# Patient Record
Sex: Female | Born: 1955
Health system: Southern US, Community
[De-identification: ages and names within clinical notes are randomized; demographics above are authoritative.]

## PROBLEM LIST (undated history)

## (undated) DIAGNOSIS — I83813 Varicose veins of bilateral lower extremities with pain: Secondary | ICD-10-CM

## (undated) DIAGNOSIS — F32A Depression, unspecified: Secondary | ICD-10-CM

## (undated) DIAGNOSIS — K2970 Gastritis, unspecified, without bleeding: Secondary | ICD-10-CM

## (undated) DIAGNOSIS — F419 Anxiety disorder, unspecified: Secondary | ICD-10-CM

## (undated) DIAGNOSIS — M1990 Unspecified osteoarthritis, unspecified site: Secondary | ICD-10-CM

## (undated) DIAGNOSIS — G4700 Insomnia, unspecified: Secondary | ICD-10-CM

## (undated) DIAGNOSIS — E785 Hyperlipidemia, unspecified: Secondary | ICD-10-CM

## (undated) DIAGNOSIS — I10 Essential (primary) hypertension: Secondary | ICD-10-CM

## (undated) DIAGNOSIS — K449 Diaphragmatic hernia without obstruction or gangrene: Secondary | ICD-10-CM

## (undated) DIAGNOSIS — R739 Hyperglycemia, unspecified: Secondary | ICD-10-CM

## (undated) HISTORY — DX: Essential (primary) hypertension: I10

## (undated) HISTORY — DX: Insomnia, unspecified: G47.00

## (undated) HISTORY — DX: Diaphragmatic hernia without obstruction or gangrene: K44.9

## (undated) HISTORY — PX: DILATION AND CURETTAGE OF UTERUS: SHX78

## (undated) HISTORY — DX: Depression, unspecified: F32.A

## (undated) HISTORY — DX: Unspecified osteoarthritis, unspecified site: M19.90

## (undated) HISTORY — DX: Hyperglycemia, unspecified: R73.9

## (undated) HISTORY — DX: Varicose veins of bilateral lower extremities with pain: I83.813

## (undated) HISTORY — DX: Hyperlipidemia, unspecified: E78.5

## (undated) HISTORY — PX: OTHER SURGICAL HISTORY: SHX169

## (undated) HISTORY — DX: Anxiety disorder, unspecified: F41.9

## (undated) HISTORY — DX: Gastritis, unspecified, without bleeding: K29.70

---

## 1991-12-09 HISTORY — PX: OTHER SURGICAL HISTORY: SHX169

## 1998-04-13 ENCOUNTER — Ambulatory Visit (HOSPITAL_COMMUNITY): Admission: RE | Admit: 1998-04-13 | Discharge: 1998-04-13 | Payer: Self-pay | Admitting: *Deleted

## 1998-04-16 ENCOUNTER — Encounter: Admission: RE | Admit: 1998-04-16 | Discharge: 1998-07-15 | Payer: Self-pay | Admitting: *Deleted

## 1998-08-06 ENCOUNTER — Encounter: Admission: RE | Admit: 1998-08-06 | Discharge: 1998-11-04 | Payer: Self-pay | Admitting: *Deleted

## 1998-08-16 ENCOUNTER — Other Ambulatory Visit: Admission: RE | Admit: 1998-08-16 | Discharge: 1998-08-16 | Payer: Self-pay | Admitting: Obstetrics and Gynecology

## 1999-08-19 ENCOUNTER — Other Ambulatory Visit: Admission: RE | Admit: 1999-08-19 | Discharge: 1999-08-19 | Payer: Self-pay | Admitting: Obstetrics and Gynecology

## 2000-03-05 ENCOUNTER — Encounter: Payer: Self-pay | Admitting: Obstetrics and Gynecology

## 2000-03-05 ENCOUNTER — Ambulatory Visit (HOSPITAL_COMMUNITY): Admission: RE | Admit: 2000-03-05 | Discharge: 2000-03-05 | Payer: Self-pay | Admitting: Obstetrics and Gynecology

## 2000-09-16 ENCOUNTER — Other Ambulatory Visit: Admission: RE | Admit: 2000-09-16 | Discharge: 2000-09-16 | Payer: Self-pay | Admitting: Obstetrics and Gynecology

## 2001-05-10 ENCOUNTER — Ambulatory Visit (HOSPITAL_COMMUNITY): Admission: RE | Admit: 2001-05-10 | Discharge: 2001-05-10 | Payer: Self-pay | Admitting: Obstetrics and Gynecology

## 2001-05-10 ENCOUNTER — Encounter: Payer: Self-pay | Admitting: Obstetrics and Gynecology

## 2001-10-12 ENCOUNTER — Other Ambulatory Visit: Admission: RE | Admit: 2001-10-12 | Discharge: 2001-10-12 | Payer: Self-pay | Admitting: Obstetrics and Gynecology

## 2002-08-15 ENCOUNTER — Encounter: Payer: Self-pay | Admitting: Obstetrics and Gynecology

## 2002-08-15 ENCOUNTER — Ambulatory Visit (HOSPITAL_COMMUNITY): Admission: RE | Admit: 2002-08-15 | Discharge: 2002-08-15 | Payer: Self-pay | Admitting: Obstetrics and Gynecology

## 2002-11-07 HISTORY — PX: FOOT FRACTURE SURGERY: SHX645

## 2003-03-09 ENCOUNTER — Other Ambulatory Visit: Admission: RE | Admit: 2003-03-09 | Discharge: 2003-03-09 | Payer: Self-pay | Admitting: Obstetrics and Gynecology

## 2003-08-18 ENCOUNTER — Ambulatory Visit (HOSPITAL_COMMUNITY): Admission: RE | Admit: 2003-08-18 | Discharge: 2003-08-18 | Payer: Self-pay | Admitting: Obstetrics and Gynecology

## 2003-08-18 ENCOUNTER — Encounter: Payer: Self-pay | Admitting: Obstetrics and Gynecology

## 2004-03-25 ENCOUNTER — Other Ambulatory Visit: Admission: RE | Admit: 2004-03-25 | Discharge: 2004-03-25 | Payer: Self-pay | Admitting: Obstetrics and Gynecology

## 2004-03-28 ENCOUNTER — Encounter: Admission: RE | Admit: 2004-03-28 | Discharge: 2004-03-28 | Payer: Self-pay | Admitting: Obstetrics and Gynecology

## 2004-08-21 ENCOUNTER — Ambulatory Visit (HOSPITAL_COMMUNITY): Admission: RE | Admit: 2004-08-21 | Discharge: 2004-08-21 | Payer: Self-pay | Admitting: Obstetrics and Gynecology

## 2005-08-13 ENCOUNTER — Other Ambulatory Visit: Admission: RE | Admit: 2005-08-13 | Discharge: 2005-08-13 | Payer: Self-pay | Admitting: Obstetrics and Gynecology

## 2005-09-26 ENCOUNTER — Ambulatory Visit (HOSPITAL_COMMUNITY): Admission: RE | Admit: 2005-09-26 | Discharge: 2005-09-26 | Payer: Self-pay | Admitting: Obstetrics and Gynecology

## 2006-10-09 ENCOUNTER — Ambulatory Visit (HOSPITAL_COMMUNITY): Admission: RE | Admit: 2006-10-09 | Discharge: 2006-10-09 | Payer: Self-pay | Admitting: Obstetrics and Gynecology

## 2007-10-12 ENCOUNTER — Encounter: Admission: RE | Admit: 2007-10-12 | Discharge: 2007-10-12 | Payer: Self-pay | Admitting: Interventional Radiology

## 2007-10-13 ENCOUNTER — Ambulatory Visit (HOSPITAL_COMMUNITY): Admission: RE | Admit: 2007-10-13 | Discharge: 2007-10-13 | Payer: Self-pay | Admitting: Obstetrics and Gynecology

## 2008-02-29 ENCOUNTER — Encounter: Admission: RE | Admit: 2008-02-29 | Discharge: 2008-02-29 | Payer: Self-pay | Admitting: Interventional Radiology

## 2008-03-07 ENCOUNTER — Encounter: Admission: RE | Admit: 2008-03-07 | Discharge: 2008-03-07 | Payer: Self-pay | Admitting: Interventional Radiology

## 2008-04-11 ENCOUNTER — Encounter: Admission: RE | Admit: 2008-04-11 | Discharge: 2008-04-11 | Payer: Self-pay | Admitting: Interventional Radiology

## 2008-04-19 ENCOUNTER — Encounter: Admission: RE | Admit: 2008-04-19 | Discharge: 2008-04-19 | Payer: Self-pay | Admitting: Interventional Radiology

## 2008-05-10 ENCOUNTER — Encounter: Admission: RE | Admit: 2008-05-10 | Discharge: 2008-05-10 | Payer: Self-pay | Admitting: Interventional Radiology

## 2008-10-03 ENCOUNTER — Encounter: Admission: RE | Admit: 2008-10-03 | Discharge: 2008-10-03 | Payer: Self-pay | Admitting: Interventional Radiology

## 2008-11-24 ENCOUNTER — Ambulatory Visit (HOSPITAL_COMMUNITY): Admission: RE | Admit: 2008-11-24 | Discharge: 2008-11-24 | Payer: Self-pay | Admitting: Obstetrics and Gynecology

## 2009-12-26 ENCOUNTER — Ambulatory Visit (HOSPITAL_COMMUNITY): Admission: RE | Admit: 2009-12-26 | Discharge: 2009-12-26 | Payer: Self-pay | Admitting: Obstetrics and Gynecology

## 2010-01-01 ENCOUNTER — Encounter: Admission: RE | Admit: 2010-01-01 | Discharge: 2010-01-01 | Payer: Self-pay | Admitting: Obstetrics and Gynecology

## 2010-12-29 ENCOUNTER — Encounter: Payer: Self-pay | Admitting: Obstetrics and Gynecology

## 2011-01-01 ENCOUNTER — Encounter
Admission: RE | Admit: 2011-01-01 | Discharge: 2011-01-01 | Payer: Self-pay | Source: Home / Self Care | Attending: Interventional Radiology | Admitting: Interventional Radiology

## 2011-01-14 ENCOUNTER — Telehealth: Payer: Self-pay | Admitting: Radiology

## 2011-01-15 ENCOUNTER — Telehealth: Payer: Self-pay | Admitting: Radiology

## 2011-01-15 ENCOUNTER — Other Ambulatory Visit: Payer: Self-pay | Admitting: Interventional Radiology

## 2011-01-15 DIAGNOSIS — I83899 Varicose veins of unspecified lower extremities with other complications: Secondary | ICD-10-CM

## 2011-01-15 DIAGNOSIS — I8390 Asymptomatic varicose veins of unspecified lower extremity: Secondary | ICD-10-CM

## 2011-01-15 DIAGNOSIS — I83819 Varicose veins of unspecified lower extremities with pain: Secondary | ICD-10-CM

## 2011-01-16 ENCOUNTER — Telehealth: Payer: Self-pay | Admitting: Radiology

## 2011-02-04 ENCOUNTER — Other Ambulatory Visit: Payer: Self-pay

## 2011-02-04 ENCOUNTER — Ambulatory Visit: Payer: Self-pay

## 2011-02-06 ENCOUNTER — Other Ambulatory Visit: Payer: Self-pay | Admitting: Obstetrics and Gynecology

## 2011-02-06 DIAGNOSIS — Z1231 Encounter for screening mammogram for malignant neoplasm of breast: Secondary | ICD-10-CM

## 2011-02-11 ENCOUNTER — Other Ambulatory Visit: Payer: Self-pay

## 2011-02-28 ENCOUNTER — Ambulatory Visit
Admission: RE | Admit: 2011-02-28 | Discharge: 2011-02-28 | Disposition: A | Discharge status: Home / Self Care | Payer: BC Managed Care – PPO | Source: Ambulatory Visit | Attending: Obstetrics and Gynecology | Admitting: Obstetrics and Gynecology

## 2011-02-28 DIAGNOSIS — Z1231 Encounter for screening mammogram for malignant neoplasm of breast: Secondary | ICD-10-CM

## 2011-05-16 NOTE — Telephone Encounter (Signed)
See above telephone encounter notes

## 2011-05-16 NOTE — Telephone Encounter (Signed)
See above telephone encounter note 

## 2011-05-29 NOTE — Telephone Encounter (Signed)
Phone call reminder

## 2014-03-23 ENCOUNTER — Other Ambulatory Visit: Payer: Self-pay | Admitting: *Deleted

## 2014-03-23 DIAGNOSIS — I83893 Varicose veins of bilateral lower extremities with other complications: Secondary | ICD-10-CM

## 2014-04-06 ENCOUNTER — Other Ambulatory Visit: Payer: Self-pay | Admitting: Endocrinology

## 2014-04-06 ENCOUNTER — Ambulatory Visit
Admission: RE | Admit: 2014-04-06 | Discharge: 2014-04-06 | Disposition: A | Discharge status: Home / Self Care | Payer: BC Managed Care – PPO | Source: Ambulatory Visit | Attending: Endocrinology | Admitting: Endocrinology

## 2014-04-06 DIAGNOSIS — R079 Chest pain, unspecified: Secondary | ICD-10-CM

## 2014-05-23 ENCOUNTER — Encounter: Payer: BC Managed Care – PPO | Admitting: Vascular Surgery

## 2014-05-23 ENCOUNTER — Encounter (HOSPITAL_COMMUNITY): Payer: BC Managed Care – PPO

## 2015-05-02 ENCOUNTER — Other Ambulatory Visit (HOSPITAL_COMMUNITY): Payer: Self-pay | Admitting: Obstetrics and Gynecology

## 2015-05-02 DIAGNOSIS — Z1231 Encounter for screening mammogram for malignant neoplasm of breast: Secondary | ICD-10-CM

## 2015-05-16 ENCOUNTER — Ambulatory Visit (HOSPITAL_COMMUNITY): Payer: BLUE CROSS/BLUE SHIELD | Attending: Obstetrics and Gynecology

## 2015-05-30 ENCOUNTER — Ambulatory Visit (HOSPITAL_COMMUNITY)
Admission: RE | Admit: 2015-05-30 | Discharge: 2015-05-30 | Disposition: A | Discharge status: Home / Self Care | Payer: BLUE CROSS/BLUE SHIELD | Source: Ambulatory Visit | Attending: Obstetrics and Gynecology | Admitting: Obstetrics and Gynecology

## 2015-05-30 DIAGNOSIS — Z1231 Encounter for screening mammogram for malignant neoplasm of breast: Secondary | ICD-10-CM

## 2016-03-10 DIAGNOSIS — R739 Hyperglycemia, unspecified: Secondary | ICD-10-CM | POA: Diagnosis not present

## 2016-03-10 DIAGNOSIS — I10 Essential (primary) hypertension: Secondary | ICD-10-CM | POA: Diagnosis not present

## 2016-03-10 DIAGNOSIS — F329 Major depressive disorder, single episode, unspecified: Secondary | ICD-10-CM | POA: Diagnosis not present

## 2016-03-10 DIAGNOSIS — Z1389 Encounter for screening for other disorder: Secondary | ICD-10-CM | POA: Diagnosis not present

## 2016-03-10 DIAGNOSIS — I491 Atrial premature depolarization: Secondary | ICD-10-CM | POA: Diagnosis not present

## 2016-05-19 ENCOUNTER — Other Ambulatory Visit: Payer: Self-pay | Admitting: Obstetrics and Gynecology

## 2016-05-19 DIAGNOSIS — Z1231 Encounter for screening mammogram for malignant neoplasm of breast: Secondary | ICD-10-CM

## 2016-05-30 DIAGNOSIS — H0014 Chalazion left upper eyelid: Secondary | ICD-10-CM | POA: Diagnosis not present

## 2016-06-02 ENCOUNTER — Ambulatory Visit
Admission: RE | Admit: 2016-06-02 | Discharge: 2016-06-02 | Disposition: A | Discharge status: Home / Self Care | Payer: BLUE CROSS/BLUE SHIELD | Source: Ambulatory Visit | Attending: Obstetrics and Gynecology | Admitting: Obstetrics and Gynecology

## 2016-06-02 DIAGNOSIS — Z1231 Encounter for screening mammogram for malignant neoplasm of breast: Secondary | ICD-10-CM | POA: Diagnosis not present

## 2016-06-04 ENCOUNTER — Other Ambulatory Visit: Payer: Self-pay | Admitting: Obstetrics and Gynecology

## 2016-06-04 DIAGNOSIS — R928 Other abnormal and inconclusive findings on diagnostic imaging of breast: Secondary | ICD-10-CM

## 2016-06-05 ENCOUNTER — Ambulatory Visit
Admission: RE | Admit: 2016-06-05 | Discharge: 2016-06-05 | Disposition: A | Discharge status: Home / Self Care | Payer: BLUE CROSS/BLUE SHIELD | Source: Ambulatory Visit | Attending: Obstetrics and Gynecology | Admitting: Obstetrics and Gynecology

## 2016-06-05 DIAGNOSIS — N63 Unspecified lump in breast: Secondary | ICD-10-CM | POA: Diagnosis not present

## 2016-06-05 DIAGNOSIS — R928 Other abnormal and inconclusive findings on diagnostic imaging of breast: Secondary | ICD-10-CM

## 2016-06-05 DIAGNOSIS — N6002 Solitary cyst of left breast: Secondary | ICD-10-CM | POA: Diagnosis not present

## 2016-06-11 ENCOUNTER — Other Ambulatory Visit: Payer: BLUE CROSS/BLUE SHIELD

## 2016-07-29 DIAGNOSIS — R7309 Other abnormal glucose: Secondary | ICD-10-CM | POA: Diagnosis not present

## 2016-07-29 DIAGNOSIS — Z0000 Encounter for general adult medical examination without abnormal findings: Secondary | ICD-10-CM | POA: Diagnosis not present

## 2016-08-05 DIAGNOSIS — Z23 Encounter for immunization: Secondary | ICD-10-CM | POA: Diagnosis not present

## 2016-08-05 DIAGNOSIS — I10 Essential (primary) hypertension: Secondary | ICD-10-CM | POA: Diagnosis not present

## 2016-08-05 DIAGNOSIS — Z0000 Encounter for general adult medical examination without abnormal findings: Secondary | ICD-10-CM | POA: Diagnosis not present

## 2016-08-05 DIAGNOSIS — E784 Other hyperlipidemia: Secondary | ICD-10-CM | POA: Diagnosis not present

## 2016-08-05 DIAGNOSIS — R7309 Other abnormal glucose: Secondary | ICD-10-CM | POA: Diagnosis not present

## 2016-08-05 DIAGNOSIS — Z6835 Body mass index (BMI) 35.0-35.9, adult: Secondary | ICD-10-CM | POA: Diagnosis not present

## 2016-08-05 DIAGNOSIS — Z1389 Encounter for screening for other disorder: Secondary | ICD-10-CM | POA: Diagnosis not present

## 2016-08-25 DIAGNOSIS — N9089 Other specified noninflammatory disorders of vulva and perineum: Secondary | ICD-10-CM | POA: Diagnosis not present

## 2016-11-19 DIAGNOSIS — J029 Acute pharyngitis, unspecified: Secondary | ICD-10-CM | POA: Diagnosis not present

## 2016-11-19 DIAGNOSIS — R05 Cough: Secondary | ICD-10-CM | POA: Diagnosis not present

## 2017-02-11 DIAGNOSIS — F3341 Major depressive disorder, recurrent, in partial remission: Secondary | ICD-10-CM | POA: Diagnosis not present

## 2017-02-11 DIAGNOSIS — F1020 Alcohol dependence, uncomplicated: Secondary | ICD-10-CM | POA: Diagnosis not present

## 2017-02-11 DIAGNOSIS — F341 Dysthymic disorder: Secondary | ICD-10-CM | POA: Diagnosis not present

## 2017-04-09 DIAGNOSIS — L710 Perioral dermatitis: Secondary | ICD-10-CM | POA: Diagnosis not present

## 2017-04-24 DIAGNOSIS — L237 Allergic contact dermatitis due to plants, except food: Secondary | ICD-10-CM | POA: Diagnosis not present

## 2017-05-07 DIAGNOSIS — L739 Follicular disorder, unspecified: Secondary | ICD-10-CM | POA: Diagnosis not present

## 2017-07-21 DIAGNOSIS — Z01419 Encounter for gynecological examination (general) (routine) without abnormal findings: Secondary | ICD-10-CM | POA: Diagnosis not present

## 2017-07-21 DIAGNOSIS — Z6835 Body mass index (BMI) 35.0-35.9, adult: Secondary | ICD-10-CM | POA: Diagnosis not present

## 2017-08-04 DIAGNOSIS — M545 Low back pain: Secondary | ICD-10-CM | POA: Diagnosis not present

## 2017-08-04 DIAGNOSIS — M25552 Pain in left hip: Secondary | ICD-10-CM | POA: Diagnosis not present

## 2017-08-05 DIAGNOSIS — Z13820 Encounter for screening for osteoporosis: Secondary | ICD-10-CM | POA: Diagnosis not present

## 2017-08-06 DIAGNOSIS — M5442 Lumbago with sciatica, left side: Secondary | ICD-10-CM | POA: Diagnosis not present

## 2017-08-06 DIAGNOSIS — M545 Low back pain: Secondary | ICD-10-CM | POA: Diagnosis not present

## 2017-09-17 ENCOUNTER — Other Ambulatory Visit: Payer: Self-pay | Admitting: Obstetrics and Gynecology

## 2017-09-17 DIAGNOSIS — Z1231 Encounter for screening mammogram for malignant neoplasm of breast: Secondary | ICD-10-CM

## 2017-09-29 DIAGNOSIS — F341 Dysthymic disorder: Secondary | ICD-10-CM | POA: Diagnosis not present

## 2017-09-29 DIAGNOSIS — F1020 Alcohol dependence, uncomplicated: Secondary | ICD-10-CM | POA: Diagnosis not present

## 2017-09-29 DIAGNOSIS — F331 Major depressive disorder, recurrent, moderate: Secondary | ICD-10-CM | POA: Diagnosis not present

## 2017-10-02 ENCOUNTER — Ambulatory Visit: Payer: BLUE CROSS/BLUE SHIELD

## 2017-10-25 DIAGNOSIS — M5442 Lumbago with sciatica, left side: Secondary | ICD-10-CM | POA: Diagnosis not present

## 2017-10-25 DIAGNOSIS — Z79899 Other long term (current) drug therapy: Secondary | ICD-10-CM | POA: Diagnosis not present

## 2017-10-25 DIAGNOSIS — Z791 Long term (current) use of non-steroidal anti-inflammatories (NSAID): Secondary | ICD-10-CM | POA: Diagnosis not present

## 2017-10-26 DIAGNOSIS — M5442 Lumbago with sciatica, left side: Secondary | ICD-10-CM | POA: Diagnosis not present

## 2017-10-27 ENCOUNTER — Ambulatory Visit: Payer: BLUE CROSS/BLUE SHIELD

## 2017-10-28 DIAGNOSIS — M545 Low back pain: Secondary | ICD-10-CM | POA: Diagnosis not present

## 2017-11-03 DIAGNOSIS — Z01812 Encounter for preprocedural laboratory examination: Secondary | ICD-10-CM | POA: Diagnosis not present

## 2017-11-03 DIAGNOSIS — M47817 Spondylosis without myelopathy or radiculopathy, lumbosacral region: Secondary | ICD-10-CM | POA: Diagnosis not present

## 2017-11-03 DIAGNOSIS — M545 Low back pain: Secondary | ICD-10-CM | POA: Diagnosis not present

## 2017-11-04 DIAGNOSIS — M545 Low back pain: Secondary | ICD-10-CM | POA: Diagnosis not present

## 2017-11-10 DIAGNOSIS — I10 Essential (primary) hypertension: Secondary | ICD-10-CM | POA: Diagnosis not present

## 2017-11-10 DIAGNOSIS — Z0000 Encounter for general adult medical examination without abnormal findings: Secondary | ICD-10-CM | POA: Diagnosis not present

## 2017-11-10 DIAGNOSIS — R7309 Other abnormal glucose: Secondary | ICD-10-CM | POA: Diagnosis not present

## 2017-11-12 DIAGNOSIS — L308 Other specified dermatitis: Secondary | ICD-10-CM | POA: Diagnosis not present

## 2017-11-12 DIAGNOSIS — Z1389 Encounter for screening for other disorder: Secondary | ICD-10-CM | POA: Diagnosis not present

## 2017-11-12 DIAGNOSIS — M545 Low back pain: Secondary | ICD-10-CM | POA: Diagnosis not present

## 2017-11-12 DIAGNOSIS — R7309 Other abnormal glucose: Secondary | ICD-10-CM | POA: Diagnosis not present

## 2017-11-12 DIAGNOSIS — Z23 Encounter for immunization: Secondary | ICD-10-CM | POA: Diagnosis not present

## 2017-11-12 DIAGNOSIS — Z6835 Body mass index (BMI) 35.0-35.9, adult: Secondary | ICD-10-CM | POA: Diagnosis not present

## 2017-11-12 DIAGNOSIS — Z0000 Encounter for general adult medical examination without abnormal findings: Secondary | ICD-10-CM | POA: Diagnosis not present

## 2017-11-12 DIAGNOSIS — R748 Abnormal levels of other serum enzymes: Secondary | ICD-10-CM | POA: Diagnosis not present

## 2017-11-18 ENCOUNTER — Other Ambulatory Visit: Payer: Self-pay | Admitting: Internal Medicine

## 2017-11-18 DIAGNOSIS — R748 Abnormal levels of other serum enzymes: Secondary | ICD-10-CM

## 2017-11-23 DIAGNOSIS — M5126 Other intervertebral disc displacement, lumbar region: Secondary | ICD-10-CM | POA: Diagnosis not present

## 2017-11-23 DIAGNOSIS — M5416 Radiculopathy, lumbar region: Secondary | ICD-10-CM | POA: Diagnosis not present

## 2017-11-23 DIAGNOSIS — M545 Low back pain: Secondary | ICD-10-CM | POA: Diagnosis not present

## 2017-11-27 DIAGNOSIS — M549 Dorsalgia, unspecified: Secondary | ICD-10-CM | POA: Diagnosis not present

## 2017-11-27 DIAGNOSIS — M546 Pain in thoracic spine: Secondary | ICD-10-CM | POA: Diagnosis not present

## 2017-11-27 DIAGNOSIS — M48062 Spinal stenosis, lumbar region with neurogenic claudication: Secondary | ICD-10-CM | POA: Diagnosis not present

## 2017-11-27 DIAGNOSIS — M4306 Spondylolysis, lumbar region: Secondary | ICD-10-CM | POA: Diagnosis not present

## 2017-11-27 DIAGNOSIS — M5136 Other intervertebral disc degeneration, lumbar region: Secondary | ICD-10-CM | POA: Diagnosis not present

## 2017-11-27 DIAGNOSIS — M5416 Radiculopathy, lumbar region: Secondary | ICD-10-CM | POA: Diagnosis not present

## 2018-01-01 DIAGNOSIS — Z1212 Encounter for screening for malignant neoplasm of rectum: Secondary | ICD-10-CM | POA: Diagnosis not present

## 2018-01-11 ENCOUNTER — Ambulatory Visit
Admission: RE | Admit: 2018-01-11 | Discharge: 2018-01-11 | Disposition: A | Discharge status: Home / Self Care | Payer: BLUE CROSS/BLUE SHIELD | Source: Ambulatory Visit | Attending: Obstetrics and Gynecology | Admitting: Obstetrics and Gynecology

## 2018-01-11 DIAGNOSIS — Z1231 Encounter for screening mammogram for malignant neoplasm of breast: Secondary | ICD-10-CM | POA: Diagnosis not present

## 2018-01-14 DIAGNOSIS — F341 Dysthymic disorder: Secondary | ICD-10-CM | POA: Diagnosis not present

## 2018-01-14 DIAGNOSIS — F1020 Alcohol dependence, uncomplicated: Secondary | ICD-10-CM | POA: Diagnosis not present

## 2018-01-14 DIAGNOSIS — F3341 Major depressive disorder, recurrent, in partial remission: Secondary | ICD-10-CM | POA: Diagnosis not present

## 2018-03-04 DIAGNOSIS — Z800 Family history of malignant neoplasm of digestive organs: Secondary | ICD-10-CM | POA: Diagnosis not present

## 2018-03-04 DIAGNOSIS — D122 Benign neoplasm of ascending colon: Secondary | ICD-10-CM | POA: Diagnosis not present

## 2018-03-04 DIAGNOSIS — K5730 Diverticulosis of large intestine without perforation or abscess without bleeding: Secondary | ICD-10-CM | POA: Diagnosis not present

## 2018-03-09 DIAGNOSIS — D122 Benign neoplasm of ascending colon: Secondary | ICD-10-CM | POA: Diagnosis not present

## 2018-04-14 DIAGNOSIS — F341 Dysthymic disorder: Secondary | ICD-10-CM | POA: Diagnosis not present

## 2018-04-14 DIAGNOSIS — F3341 Major depressive disorder, recurrent, in partial remission: Secondary | ICD-10-CM | POA: Diagnosis not present

## 2018-05-12 ENCOUNTER — Other Ambulatory Visit: Payer: Self-pay

## 2018-05-12 DIAGNOSIS — R05 Cough: Secondary | ICD-10-CM | POA: Diagnosis not present

## 2018-05-12 DIAGNOSIS — J019 Acute sinusitis, unspecified: Secondary | ICD-10-CM | POA: Diagnosis not present

## 2018-05-12 DIAGNOSIS — Z6835 Body mass index (BMI) 35.0-35.9, adult: Secondary | ICD-10-CM | POA: Diagnosis not present

## 2018-05-12 DIAGNOSIS — I83891 Varicose veins of right lower extremities with other complications: Secondary | ICD-10-CM

## 2018-06-24 DIAGNOSIS — L239 Allergic contact dermatitis, unspecified cause: Secondary | ICD-10-CM | POA: Diagnosis not present

## 2018-07-07 ENCOUNTER — Encounter (HOSPITAL_COMMUNITY): Payer: BLUE CROSS/BLUE SHIELD

## 2018-07-07 ENCOUNTER — Encounter: Payer: BLUE CROSS/BLUE SHIELD | Admitting: Vascular Surgery

## 2018-08-25 DIAGNOSIS — Z6835 Body mass index (BMI) 35.0-35.9, adult: Secondary | ICD-10-CM | POA: Diagnosis not present

## 2018-08-25 DIAGNOSIS — Z01419 Encounter for gynecological examination (general) (routine) without abnormal findings: Secondary | ICD-10-CM | POA: Diagnosis not present

## 2018-09-01 ENCOUNTER — Ambulatory Visit (HOSPITAL_COMMUNITY)
Admission: RE | Admit: 2018-09-01 | Discharge: 2018-09-01 | Disposition: A | Discharge status: Home / Self Care | Payer: BLUE CROSS/BLUE SHIELD | Source: Ambulatory Visit | Attending: Vascular Surgery | Admitting: Vascular Surgery

## 2018-09-01 ENCOUNTER — Encounter: Payer: Self-pay | Admitting: Vascular Surgery

## 2018-09-01 ENCOUNTER — Ambulatory Visit (INDEPENDENT_AMBULATORY_CARE_PROVIDER_SITE_OTHER): Payer: BLUE CROSS/BLUE SHIELD | Admitting: Vascular Surgery

## 2018-09-01 VITALS — BP 139/89 | HR 71 | Temp 97.6°F | Resp 16 | Ht 67.0 in | Wt 227.0 lb

## 2018-09-01 DIAGNOSIS — I83811 Varicose veins of right lower extremities with pain: Secondary | ICD-10-CM | POA: Diagnosis not present

## 2018-09-01 DIAGNOSIS — I83891 Varicose veins of right lower extremities with other complications: Secondary | ICD-10-CM | POA: Diagnosis not present

## 2018-09-01 NOTE — Progress Notes (Signed)
REASON FOR CONSULT:    Painful varicose veins right lower extremity.  Consult is requested by Dr. Timothy Lasso.  HPI:   Patricia Mendoza is a pleasant 62 y.o. female, who has previously undergone endovenous laser ablation of the right great saphenous vein by the interventional radiologist in 2009.  Over the years she is developed gradually enlarging varicose veins of the right thigh and right leg which have become quite painful.  She has been wearing thigh-high compression stockings with a gradient of 20 to 30 mmHg over the last year with some relief of symptoms.  However, she is continuing to have significant pain especially when she is standing or sitting for long periods of time.  She also takes ibuprofen as needed for pain.  She elevates her legs and this does help temporarily.  She is unaware of any previous history of DVT or phlebitis.  I have reviewed the records from the referring office.  The patient was seen on 04/23/2018.  This was for a preventive health care visit.  She does have some low back pain with some disc disease at L4 and L5.  She is referred for evaluation of her painful varicose veins.  History reviewed. No pertinent past medical history.  History reviewed. No pertinent family history.  SOCIAL HISTORY: Social History   Socioeconomic History  . Marital status: Divorced    Spouse name: Not on file  . Number of children: Not on file  . Years of education: Not on file  . Highest education level: Not on file  Occupational History  . Not on file  Social Needs  . Financial resource strain: Not on file  . Food insecurity:    Worry: Not on file    Inability: Not on file  . Transportation needs:    Medical: Not on file    Non-medical: Not on file  Tobacco Use  . Smoking status: Former Smoker    Last attempt to quit: 01/01/1994    Years since quitting: 24.6  . Smokeless tobacco: Never Used  Substance and Sexual Activity  . Alcohol use: Not on file  . Drug use: Not  on file  . Sexual activity: Not on file  Lifestyle  . Physical activity:    Days per week: Not on file    Minutes per session: Not on file  . Stress: Not on file  Relationships  . Social connections:    Talks on phone: Not on file    Gets together: Not on file    Attends religious service: Not on file    Active member of club or organization: Not on file    Attends meetings of clubs or organizations: Not on file    Relationship status: Not on file  . Intimate partner violence:    Fear of current or ex partner: Not on file    Emotionally abused: Not on file    Physically abused: Not on file    Forced sexual activity: Not on file  Other Topics Concern  . Not on file  Social History Narrative  . Not on file    No Known Allergies  Current Outpatient Medications  Medication Sig Dispense Refill  . atorvastatin (LIPITOR) 80 MG tablet Take 80 mg by mouth daily.  2  . busPIRone (BUSPAR) 5 MG tablet TAKE 3 TABS BY MOUTH TWICE A DAY  2  . fluticasone (CUTIVATE) 0.05 % cream APPLY THIN LAYER TO FACE TWICE DAILY  2  . lisinopril-hydrochlorothiazide (PRINZIDE,ZESTORETIC) 20-12.5  MG tablet Take 1 tablet by mouth daily.  6  . venlafaxine XR (EFFEXOR-XR) 150 MG 24 hr capsule TAKE 2 CAPSULES BY MOUTH EVERY MORNING WITH FOOD  1  . busPIRone (BUSPAR) 10 MG tablet Take 20 mg by mouth 1 day or 1 dose.   0   No current facility-administered medications for this visit.     REVIEW OF SYSTEMS:  [X]  denotes positive finding, [ ]  denotes negative finding Cardiac  Comments:  Chest pain or chest pressure:    Shortness of breath upon exertion:    Short of breath when lying flat:    Irregular heart rhythm:        Vascular    Pain in calf, thigh, or hip brought on by ambulation: x   Pain in feet at night that wakes you up from your sleep:     Blood clot in your veins:    Leg swelling:         Pulmonary    Oxygen at home:    Productive cough:     Wheezing:         Neurologic    Sudden  weakness in arms or legs:     Sudden numbness in arms or legs:     Sudden onset of difficulty speaking or slurred speech:    Temporary loss of vision in one eye:     Problems with dizziness:         Gastrointestinal    Blood in stool:     Vomited blood:         Genitourinary    Burning when urinating:     Blood in urine:        Psychiatric    Major depression:         Hematologic    Bleeding problems:    Problems with blood clotting too easily:        Skin    Rashes or ulcers:        Constitutional    Fever or chills:     PHYSICAL EXAM:   Vitals:   09/01/18 1134  BP: 139/89  Pulse: 71  Resp: 16  Temp: 97.6 F (36.4 C)  SpO2: 95%  Weight: 227 lb (103 kg)  Height: 5\' 7"  (1.702 m)    GENERAL: The patient is a well-nourished female, in no acute distress. The vital signs are documented above. CARDIAC: There is a regular rate and rhythm.  VASCULAR:  ARTERIAL:  I do not detect carotid bruits. She has palpable pedal pulses. VENOUS: She has large truncal varicosities under significant pressure in the medial right thigh and anterior medial right leg.  She also has some varicose veins in her anterior left leg.  Currently she does not have significant hyperpigmentation or leg swelling. PULMONARY: There is good air exchange bilaterally without wheezing or rales. ABDOMEN: Soft and non-tender with normal pitched bowel sounds.  MUSCULOSKELETAL: There are no major deformities or cyanosis. NEUROLOGIC: No focal weakness or paresthesias are detected. SKIN: There are no ulcers or rashes noted. PSYCHIATRIC: The patient has a normal affect.  DATA:    VENOUS DUPLEX: I have independently interpreted her venous duplex of the right lower extremity.  There is no evidence of DVT or superficial thrombophlebitis.  There is deep venous reflux involving the common femoral vein.  The great saphenous vein on the right has been ablated.  There is no significant reflux in the short saphenous vein  on the right.  There is a  short segment of anterior accessory saphenous vein with reflux but this is very short and not especially enlarged.   ASSESSMENT & PLAN:   PAINFUL VARICOSE VEINS RIGHT LOWER EXTREMITY: This patient continues to have significant pain related to her varicose veins in the right lower extremity.  She is tried conservative treatment including thigh-high compression stockings, ibuprofen, and leg elevation.  She continues to have significant symptoms I think she would be a candidate for greater than 20 stab phlebectomies for the varicose veins of her right lower extremity.  In addition we have discussed conservative treatment of her varicose veins including daily leg elevation the proper positioning for this.  The use of compression stockings, the importance of avoiding prolonged sitting and standing.  In addition we discussed importance of exercise and weight management.  She feels that her symptoms are significantly disabling and she would like to pursue intervention.  We have discussed the indications for the procedure and potential complications.  Hopefully we can schedule this in the near future.   Waverly Ferrari [facility] [phone]

## 2018-10-07 ENCOUNTER — Ambulatory Visit: Payer: BLUE CROSS/BLUE SHIELD | Admitting: Vascular Surgery

## 2018-10-07 ENCOUNTER — Encounter: Payer: Self-pay | Admitting: Vascular Surgery

## 2018-10-07 VITALS — BP 110/76 | HR 72 | Temp 97.4°F | Resp 16 | Ht 67.0 in | Wt 225.0 lb

## 2018-10-07 DIAGNOSIS — I83811 Varicose veins of right lower extremities with pain: Secondary | ICD-10-CM

## 2018-10-07 NOTE — Progress Notes (Signed)
    Stab Phlebectomy Procedure  Patricia Mendoza DOB:29-Apr-1956  10/07/2018  Consent signed: Yes  Surgeon:C. Edilia Bo, MD  Procedure: stab phlebectomy: right leg  BP 110/76   Pulse 72   Temp (!) 97.4 F (36.3 C)   Resp 16   Ht 5\' 7"  (1.702 m)   Wt 225 lb (102.1 kg)   SpO2 98%   BMI 35.24 kg/m   Start time: 10:20   End time: 11:35   Tumescent Anesthesia: 475 cc 0.9% NaCl with 50 cc Lidocaine HCL with 1% Epi and 15 cc 8.4% NaHCO3  Local Anesthesia: 4 cc Lidocaine HCL and NaHCO3 (ratio 2:1)    Stab Phlebectomy: >20 Sites: Thigh and Calf  Patient tolerated procedure well: Yes  Notes:   Description of Procedure:  After marking the course of the secondary varicosities, the patient was placed on the operating table in the supine position, and the right leg was prepped and draped in sterile fashion.    The patient was then put into Trendelenburg position.  Local anesthetic was administered at the previously marked varicosities, and tumescent anesthesia was administered around the vessels.  Greater than 20 stab wounds were made using the tip of an 11 blade. And using the vein hook, the phlebectomies were performed using a hemostat to avulse the varicosities.  Adequate hemostasis was achieved, and steri strips were applied to the stab wound.      ABD pads and thigh high compression stockings were applied as well ace wraps where needed. Blood loss was less than 15 cc.  The patient ambulated out of the operating room having tolerated the procedure well.

## 2018-10-07 NOTE — Progress Notes (Signed)
   Patient name: TIRZAH FROSS MRN: [id number] DOB: 1956-02-13 Sex: female  REASON FOR VISIT:   For stab phlebectomies right lower extremity.  HPI:   BENNETTE HASTY is a pleasant 62 y.o. female who I last saw on 09/01/2018.  She had painful varicose veins of her right lower extremity and had failed conservative treatment.  She was felt to be a good candidate for greater than 20 stab phlebectomies of the painful varicose veins of her right lower extremity.  Current Outpatient Medications  Medication Sig Dispense Refill  . atorvastatin (LIPITOR) 80 MG tablet Take 80 mg by mouth daily.  2  . busPIRone (BUSPAR) 10 MG tablet Take 20 mg by mouth 1 day or 1 dose.   0  . busPIRone (BUSPAR) 5 MG tablet TAKE 3 TABS BY MOUTH TWICE A DAY  2  . fluticasone (CUTIVATE) 0.05 % cream APPLY THIN LAYER TO FACE TWICE DAILY  2  . lisinopril-hydrochlorothiazide (PRINZIDE,ZESTORETIC) 20-12.5 MG tablet Take 1 tablet by mouth daily.  6  . venlafaxine XR (EFFEXOR-XR) 150 MG 24 hr capsule TAKE 2 CAPSULES BY MOUTH EVERY MORNING WITH FOOD  1   No current facility-administered medications for this visit.     REVIEW OF SYSTEMS:  [X]  denotes positive finding, [ ]  denotes negative finding Vascular    Leg swelling    Cardiac    Chest pain or chest pressure:    Shortness of breath upon exertion:    Short of breath when lying flat:    Irregular heart rhythm:    Constitutional    Fever or chills:     PHYSICAL EXAM:   Vitals:   10/07/18 1007  BP: 110/76  Pulse: 72  Resp: 16  Temp: (!) 97.4 F (36.3 C)  SpO2: 98%  Weight: 225 lb (102.1 kg)  Height: 5\' 7"  (1.702 m)    GENERAL: The patient is a well-nourished female, in no acute distress. The vital signs are documented above. CARDIOVASCULAR: There is a regular rate and rhythm. PULMONARY: There is good air exchange bilaterally without wheezing or rales.  DATA:   No new data  MEDICAL ISSUES:   GREATER THAN 20 STAB PHLEBECTOMIES RIGHT LOWER  EXTREMITY: The patient was taken to the exam room and the varicose veins in the right leg were marked with the patient standing.  The patient's right leg was then prepped and draped in usual sterile fashion.  The skin was infiltrated with 1% lidocaine.  Tumescent anesthesia was infiltrated under all the marked areas.  Using approximately 25 small incisions with an 11 blade the vein was retracted above the skin surface and grasped with a hemostat and gently removed using blunt dissection.  Pressure was held.  A sterile dressing was applied.  The patient tolerated procedure well.  Patient has painful varicose veins of the left lower extremity which have failed conservative treatment so when she returns for her follow-up visit in 3 weeks we will obtain a venous duplex of the left lower extremity in order to further evaluate these.  Waverly Ferrari [facility] [phone]

## 2018-10-19 ENCOUNTER — Other Ambulatory Visit: Payer: Self-pay

## 2018-10-19 DIAGNOSIS — I83891 Varicose veins of right lower extremities with other complications: Secondary | ICD-10-CM

## 2018-10-21 ENCOUNTER — Other Ambulatory Visit: Payer: Self-pay

## 2018-10-21 ENCOUNTER — Encounter: Payer: Self-pay | Admitting: Vascular Surgery

## 2018-10-21 ENCOUNTER — Ambulatory Visit (INDEPENDENT_AMBULATORY_CARE_PROVIDER_SITE_OTHER): Payer: BLUE CROSS/BLUE SHIELD | Admitting: Vascular Surgery

## 2018-10-21 ENCOUNTER — Ambulatory Visit (HOSPITAL_COMMUNITY)
Admission: RE | Admit: 2018-10-21 | Discharge: 2018-10-21 | Disposition: A | Discharge status: Home / Self Care | Payer: BLUE CROSS/BLUE SHIELD | Source: Ambulatory Visit | Attending: Vascular Surgery | Admitting: Vascular Surgery

## 2018-10-21 VITALS — BP 139/80 | HR 72 | Temp 98.6°F | Resp 16 | Ht 67.0 in | Wt 224.0 lb

## 2018-10-21 DIAGNOSIS — I83891 Varicose veins of right lower extremities with other complications: Secondary | ICD-10-CM | POA: Insufficient documentation

## 2018-10-21 DIAGNOSIS — Z48812 Encounter for surgical aftercare following surgery on the circulatory system: Secondary | ICD-10-CM

## 2018-10-21 DIAGNOSIS — Z23 Encounter for immunization: Secondary | ICD-10-CM | POA: Diagnosis not present

## 2018-10-21 NOTE — Progress Notes (Signed)
   Patient name: Patricia Mendoza MRN: [id number] DOB: [birth year] Sex: female  REASON FOR VISIT:   Follow-up of venous disease.  HPI:   Patricia Mendoza is a pleasant 62 y.o. female who had a greater than 20 stab phlebectomies of the right lower extremity on 10/07/2018.  She is previously had endovenous laser ablation of the right great saphenous vein.  There was no significant reflux in the small saphenous vein on the right.  Patient comes in for a follow-up visit.  She is doing well status post her stab phlebectomies.  Her pain in her right leg has improved since the procedure.  She has some varicose veins on the left but these are not especially bothersome.  Current Outpatient Medications  Medication Sig Dispense Refill  . atorvastatin (LIPITOR) 80 MG tablet Take 80 mg by mouth daily.  2  . busPIRone (BUSPAR) 10 MG tablet Take 20 mg by mouth 1 day or 1 dose.   0  . busPIRone (BUSPAR) 5 MG tablet TAKE 3 TABS BY MOUTH TWICE A DAY  2  . fluticasone (CUTIVATE) 0.05 % cream APPLY THIN LAYER TO FACE TWICE DAILY  2  . lisinopril-hydrochlorothiazide (PRINZIDE,ZESTORETIC) 20-12.5 MG tablet Take 1 tablet by mouth daily.  6  . venlafaxine XR (EFFEXOR-XR) 150 MG 24 hr capsule TAKE 2 CAPSULES BY MOUTH EVERY MORNING WITH FOOD  1   No current facility-administered medications for this visit.     REVIEW OF SYSTEMS:  [X]  denotes positive finding, [ ]  denotes negative finding Vascular    Leg swelling    Cardiac    Chest pain or chest pressure:    Shortness of breath upon exertion:    Short of breath when lying flat:    Irregular heart rhythm:    Constitutional    Fever or chills:     PHYSICAL EXAM:   Vitals:   10/21/18 1240  BP: 139/80  Pulse: 72  Resp: 16  Temp: 98.6 F (37 C)  TempSrc: Oral  SpO2: 95%  Weight: 224 lb (101.6 kg)  Height: 5\' 7"  (1.702 m)    GENERAL: The patient is a well-nourished female, in no acute distress. The vital signs are documented  above. CARDIOVASCULAR: There is a regular rate and rhythm. PULMONARY: There is good air exchange bilaterally without wheezing or rales. She has some mild bruising in the medial right thigh. She does have varicose veins along the anterior medial aspect of her distal left thigh extending onto her anterior left leg.  DATA:   VENOUS DUPLEX: I HAVE INABILITY INTERPRETED HER VENOUS DUPLEX SCAN ON THE LEFT.  SHE HAS NO EVIDENCE OF DVT OR SUPERFICIAL THROMBOPHLEBITIS.  THERE IS REFLUX IN THE DEEP SYSTEM INVOLVING THE COMMON FEMORAL VEIN.  THERE IS ALSO REFLUX AT THE SAPHENOFEMORAL JUNCTION.  THERE IS NO OTHER REFLUX IN THE SUPERFICIAL SYSTEM.  MEDICAL ISSUES:   CHRONIC VENOUS INSUFFICIENCY: The patient is doing well status post greater than 20 stab phlebectomies of the right lower extremity.  She will continue to elevate her leg and wear compression stockings.  She is planning to return to work soon.  With respect to her left leg her symptoms are tolerable.  At this point I would continue conservative treatment and hold off on statin phlebectomies and lesser symptoms progress.  I will be happy to see her at any time if her symptoms progress.  Waverly Ferrarihristopher Dickson [facility] [phone]

## 2018-11-14 DIAGNOSIS — M25562 Pain in left knee: Secondary | ICD-10-CM | POA: Diagnosis not present

## 2018-11-14 DIAGNOSIS — E7800 Pure hypercholesterolemia, unspecified: Secondary | ICD-10-CM | POA: Diagnosis not present

## 2018-11-14 DIAGNOSIS — S8992XA Unspecified injury of left lower leg, initial encounter: Secondary | ICD-10-CM | POA: Diagnosis not present

## 2018-11-14 DIAGNOSIS — I10 Essential (primary) hypertension: Secondary | ICD-10-CM | POA: Diagnosis not present

## 2018-11-14 DIAGNOSIS — Y929 Unspecified place or not applicable: Secondary | ICD-10-CM | POA: Diagnosis not present

## 2018-11-14 DIAGNOSIS — S82092A Other fracture of left patella, initial encounter for closed fracture: Secondary | ICD-10-CM | POA: Diagnosis not present

## 2018-11-14 DIAGNOSIS — W19XXXA Unspecified fall, initial encounter: Secondary | ICD-10-CM | POA: Diagnosis not present

## 2018-11-14 DIAGNOSIS — Y9301 Activity, walking, marching and hiking: Secondary | ICD-10-CM | POA: Diagnosis not present

## 2018-11-14 DIAGNOSIS — W010XXA Fall on same level from slipping, tripping and stumbling without subsequent striking against object, initial encounter: Secondary | ICD-10-CM | POA: Diagnosis not present

## 2018-11-16 DIAGNOSIS — M238X2 Other internal derangements of left knee: Secondary | ICD-10-CM | POA: Diagnosis not present

## 2018-11-16 DIAGNOSIS — S82032A Displaced transverse fracture of left patella, initial encounter for closed fracture: Secondary | ICD-10-CM | POA: Diagnosis not present

## 2018-11-17 HISTORY — PX: PATELLA FRACTURE SURGERY: SHX735

## 2018-11-18 ENCOUNTER — Ambulatory Visit: Payer: BLUE CROSS/BLUE SHIELD | Admitting: Vascular Surgery

## 2018-11-18 ENCOUNTER — Encounter (HOSPITAL_COMMUNITY): Payer: BLUE CROSS/BLUE SHIELD

## 2018-11-18 DIAGNOSIS — G8918 Other acute postprocedural pain: Secondary | ICD-10-CM | POA: Diagnosis not present

## 2018-11-18 DIAGNOSIS — S82032A Displaced transverse fracture of left patella, initial encounter for closed fracture: Secondary | ICD-10-CM | POA: Diagnosis not present

## 2018-11-18 DIAGNOSIS — X58XXXA Exposure to other specified factors, initial encounter: Secondary | ICD-10-CM | POA: Diagnosis not present

## 2018-11-18 DIAGNOSIS — S82002A Unspecified fracture of left patella, initial encounter for closed fracture: Secondary | ICD-10-CM | POA: Diagnosis not present

## 2018-11-18 DIAGNOSIS — W19XXXA Unspecified fall, initial encounter: Secondary | ICD-10-CM | POA: Diagnosis not present

## 2018-11-25 DIAGNOSIS — S82002D Unspecified fracture of left patella, subsequent encounter for closed fracture with routine healing: Secondary | ICD-10-CM | POA: Diagnosis not present

## 2018-11-26 ENCOUNTER — Ambulatory Visit: Admit: 2018-11-26 | Payer: BLUE CROSS/BLUE SHIELD | Admitting: Orthopedic Surgery

## 2018-11-26 SURGERY — OPEN REDUCTION INTERNAL FIXATION (ORIF) PATELLA
Anesthesia: Choice | Laterality: Left

## 2018-11-30 DIAGNOSIS — S82002D Unspecified fracture of left patella, subsequent encounter for closed fracture with routine healing: Secondary | ICD-10-CM | POA: Diagnosis not present

## 2018-12-16 DIAGNOSIS — S82002D Unspecified fracture of left patella, subsequent encounter for closed fracture with routine healing: Secondary | ICD-10-CM | POA: Diagnosis not present

## 2018-12-30 DIAGNOSIS — S82002D Unspecified fracture of left patella, subsequent encounter for closed fracture with routine healing: Secondary | ICD-10-CM | POA: Diagnosis not present

## 2019-01-04 DIAGNOSIS — M25662 Stiffness of left knee, not elsewhere classified: Secondary | ICD-10-CM | POA: Diagnosis not present

## 2019-01-04 DIAGNOSIS — R262 Difficulty in walking, not elsewhere classified: Secondary | ICD-10-CM | POA: Diagnosis not present

## 2019-01-04 DIAGNOSIS — M6281 Muscle weakness (generalized): Secondary | ICD-10-CM | POA: Diagnosis not present

## 2019-01-13 DIAGNOSIS — S82002D Unspecified fracture of left patella, subsequent encounter for closed fracture with routine healing: Secondary | ICD-10-CM | POA: Diagnosis not present

## 2019-01-27 DIAGNOSIS — M25562 Pain in left knee: Secondary | ICD-10-CM | POA: Diagnosis not present

## 2019-01-28 DIAGNOSIS — S82002D Unspecified fracture of left patella, subsequent encounter for closed fracture with routine healing: Secondary | ICD-10-CM | POA: Diagnosis not present

## 2019-01-28 DIAGNOSIS — M6281 Muscle weakness (generalized): Secondary | ICD-10-CM | POA: Diagnosis not present

## 2019-01-28 DIAGNOSIS — M25662 Stiffness of left knee, not elsewhere classified: Secondary | ICD-10-CM | POA: Diagnosis not present

## 2019-01-28 DIAGNOSIS — R262 Difficulty in walking, not elsewhere classified: Secondary | ICD-10-CM | POA: Diagnosis not present

## 2019-02-10 ENCOUNTER — Other Ambulatory Visit: Payer: Self-pay | Admitting: Obstetrics and Gynecology

## 2019-02-10 DIAGNOSIS — Z1231 Encounter for screening mammogram for malignant neoplasm of breast: Secondary | ICD-10-CM

## 2019-02-15 DIAGNOSIS — S82002D Unspecified fracture of left patella, subsequent encounter for closed fracture with routine healing: Secondary | ICD-10-CM | POA: Diagnosis not present

## 2019-03-01 DIAGNOSIS — F3341 Major depressive disorder, recurrent, in partial remission: Secondary | ICD-10-CM | POA: Diagnosis not present

## 2019-03-01 DIAGNOSIS — F341 Dysthymic disorder: Secondary | ICD-10-CM | POA: Diagnosis not present

## 2019-03-04 DIAGNOSIS — Z1331 Encounter for screening for depression: Secondary | ICD-10-CM | POA: Diagnosis not present

## 2019-03-04 DIAGNOSIS — E7849 Other hyperlipidemia: Secondary | ICD-10-CM | POA: Diagnosis not present

## 2019-03-04 DIAGNOSIS — Z0000 Encounter for general adult medical examination without abnormal findings: Secondary | ICD-10-CM | POA: Diagnosis not present

## 2019-03-04 DIAGNOSIS — M545 Low back pain: Secondary | ICD-10-CM | POA: Diagnosis not present

## 2019-03-04 DIAGNOSIS — Z23 Encounter for immunization: Secondary | ICD-10-CM | POA: Diagnosis not present

## 2019-03-04 DIAGNOSIS — R748 Abnormal levels of other serum enzymes: Secondary | ICD-10-CM | POA: Diagnosis not present

## 2019-03-04 DIAGNOSIS — I10 Essential (primary) hypertension: Secondary | ICD-10-CM | POA: Diagnosis not present

## 2019-03-04 DIAGNOSIS — R7309 Other abnormal glucose: Secondary | ICD-10-CM | POA: Diagnosis not present

## 2019-03-10 ENCOUNTER — Ambulatory Visit: Payer: BLUE CROSS/BLUE SHIELD

## 2019-07-26 ENCOUNTER — Other Ambulatory Visit: Payer: Self-pay

## 2019-07-26 ENCOUNTER — Ambulatory Visit
Admission: RE | Admit: 2019-07-26 | Discharge: 2019-07-26 | Disposition: A | Discharge status: Home / Self Care | Payer: BC Managed Care – PPO | Source: Ambulatory Visit | Attending: Obstetrics and Gynecology | Admitting: Obstetrics and Gynecology

## 2019-07-26 DIAGNOSIS — Z1231 Encounter for screening mammogram for malignant neoplasm of breast: Secondary | ICD-10-CM | POA: Diagnosis not present

## 2019-08-16 DIAGNOSIS — F341 Dysthymic disorder: Secondary | ICD-10-CM | POA: Diagnosis not present

## 2019-08-16 DIAGNOSIS — F3341 Major depressive disorder, recurrent, in partial remission: Secondary | ICD-10-CM | POA: Diagnosis not present

## 2019-09-09 DIAGNOSIS — Z20828 Contact with and (suspected) exposure to other viral communicable diseases: Secondary | ICD-10-CM | POA: Diagnosis not present

## 2019-09-13 DIAGNOSIS — Z01419 Encounter for gynecological examination (general) (routine) without abnormal findings: Secondary | ICD-10-CM | POA: Diagnosis not present

## 2019-09-13 DIAGNOSIS — Z6835 Body mass index (BMI) 35.0-35.9, adult: Secondary | ICD-10-CM | POA: Diagnosis not present

## 2019-09-13 DIAGNOSIS — N952 Postmenopausal atrophic vaginitis: Secondary | ICD-10-CM | POA: Diagnosis not present

## 2019-09-16 DIAGNOSIS — Z23 Encounter for immunization: Secondary | ICD-10-CM | POA: Diagnosis not present

## 2019-09-29 ENCOUNTER — Other Ambulatory Visit: Payer: Self-pay

## 2019-09-29 DIAGNOSIS — Z20822 Contact with and (suspected) exposure to covid-19: Secondary | ICD-10-CM

## 2019-10-01 LAB — NOVEL CORONAVIRUS, NAA: SARS-CoV-2, NAA: NOT DETECTED

## 2019-11-15 ENCOUNTER — Other Ambulatory Visit: Payer: Self-pay

## 2019-11-15 DIAGNOSIS — Z20822 Contact with and (suspected) exposure to covid-19: Secondary | ICD-10-CM

## 2019-11-17 LAB — NOVEL CORONAVIRUS, NAA: SARS-CoV-2, NAA: NOT DETECTED

## 2019-11-28 DIAGNOSIS — Z20828 Contact with and (suspected) exposure to other viral communicable diseases: Secondary | ICD-10-CM | POA: Diagnosis not present

## 2019-11-30 ENCOUNTER — Other Ambulatory Visit: Payer: BC Managed Care – PPO

## 2020-01-30 ENCOUNTER — Ambulatory Visit: Payer: BC Managed Care – PPO | Attending: Internal Medicine

## 2020-01-30 DIAGNOSIS — Z20822 Contact with and (suspected) exposure to covid-19: Secondary | ICD-10-CM

## 2020-01-31 LAB — NOVEL CORONAVIRUS, NAA: SARS-CoV-2, NAA: NOT DETECTED

## 2020-02-14 DIAGNOSIS — F341 Dysthymic disorder: Secondary | ICD-10-CM | POA: Diagnosis not present

## 2020-02-14 DIAGNOSIS — F3341 Major depressive disorder, recurrent, in partial remission: Secondary | ICD-10-CM | POA: Diagnosis not present

## 2020-03-06 DIAGNOSIS — R739 Hyperglycemia, unspecified: Secondary | ICD-10-CM | POA: Diagnosis not present

## 2020-03-06 DIAGNOSIS — I10 Essential (primary) hypertension: Secondary | ICD-10-CM | POA: Diagnosis not present

## 2020-03-06 DIAGNOSIS — E7849 Other hyperlipidemia: Secondary | ICD-10-CM | POA: Diagnosis not present

## 2020-03-06 DIAGNOSIS — Z0000 Encounter for general adult medical examination without abnormal findings: Secondary | ICD-10-CM | POA: Diagnosis not present

## 2020-03-13 DIAGNOSIS — M545 Low back pain: Secondary | ICD-10-CM | POA: Diagnosis not present

## 2020-03-13 DIAGNOSIS — I10 Essential (primary) hypertension: Secondary | ICD-10-CM | POA: Diagnosis not present

## 2020-03-13 DIAGNOSIS — Z0000 Encounter for general adult medical examination without abnormal findings: Secondary | ICD-10-CM | POA: Diagnosis not present

## 2020-03-13 DIAGNOSIS — R739 Hyperglycemia, unspecified: Secondary | ICD-10-CM | POA: Diagnosis not present

## 2020-03-13 DIAGNOSIS — R82998 Other abnormal findings in urine: Secondary | ICD-10-CM | POA: Diagnosis not present

## 2020-03-13 DIAGNOSIS — R748 Abnormal levels of other serum enzymes: Secondary | ICD-10-CM | POA: Diagnosis not present

## 2020-03-13 DIAGNOSIS — H9190 Unspecified hearing loss, unspecified ear: Secondary | ICD-10-CM | POA: Diagnosis not present

## 2020-06-26 ENCOUNTER — Other Ambulatory Visit: Payer: Self-pay | Admitting: Obstetrics and Gynecology

## 2020-06-26 DIAGNOSIS — Z1231 Encounter for screening mammogram for malignant neoplasm of breast: Secondary | ICD-10-CM

## 2020-07-10 DIAGNOSIS — F3341 Major depressive disorder, recurrent, in partial remission: Secondary | ICD-10-CM | POA: Diagnosis not present

## 2020-07-10 DIAGNOSIS — F341 Dysthymic disorder: Secondary | ICD-10-CM | POA: Diagnosis not present

## 2020-07-18 DIAGNOSIS — Z20822 Contact with and (suspected) exposure to covid-19: Secondary | ICD-10-CM | POA: Diagnosis not present

## 2020-07-23 DIAGNOSIS — Z20822 Contact with and (suspected) exposure to covid-19: Secondary | ICD-10-CM | POA: Diagnosis not present

## 2020-07-25 DIAGNOSIS — Z1212 Encounter for screening for malignant neoplasm of rectum: Secondary | ICD-10-CM | POA: Diagnosis not present

## 2020-08-21 ENCOUNTER — Ambulatory Visit: Payer: BC Managed Care – PPO

## 2020-09-03 DIAGNOSIS — Z20822 Contact with and (suspected) exposure to covid-19: Secondary | ICD-10-CM | POA: Diagnosis not present

## 2020-09-13 DIAGNOSIS — Z13820 Encounter for screening for osteoporosis: Secondary | ICD-10-CM | POA: Diagnosis not present

## 2020-09-23 DIAGNOSIS — M25562 Pain in left knee: Secondary | ICD-10-CM | POA: Diagnosis not present

## 2020-10-09 DIAGNOSIS — S83282A Other tear of lateral meniscus, current injury, left knee, initial encounter: Secondary | ICD-10-CM | POA: Diagnosis not present

## 2020-10-09 DIAGNOSIS — F3341 Major depressive disorder, recurrent, in partial remission: Secondary | ICD-10-CM | POA: Diagnosis not present

## 2020-10-09 DIAGNOSIS — F341 Dysthymic disorder: Secondary | ICD-10-CM | POA: Diagnosis not present

## 2020-10-09 DIAGNOSIS — F1020 Alcohol dependence, uncomplicated: Secondary | ICD-10-CM | POA: Diagnosis not present

## 2020-10-16 DIAGNOSIS — Z01419 Encounter for gynecological examination (general) (routine) without abnormal findings: Secondary | ICD-10-CM | POA: Diagnosis not present

## 2020-10-16 DIAGNOSIS — Z6834 Body mass index (BMI) 34.0-34.9, adult: Secondary | ICD-10-CM | POA: Diagnosis not present

## 2020-10-16 DIAGNOSIS — Z23 Encounter for immunization: Secondary | ICD-10-CM | POA: Diagnosis not present

## 2021-01-09 DIAGNOSIS — F331 Major depressive disorder, recurrent, moderate: Secondary | ICD-10-CM | POA: Diagnosis not present

## 2021-01-09 DIAGNOSIS — F341 Dysthymic disorder: Secondary | ICD-10-CM | POA: Diagnosis not present

## 2021-01-09 DIAGNOSIS — F3341 Major depressive disorder, recurrent, in partial remission: Secondary | ICD-10-CM | POA: Diagnosis not present

## 2021-01-13 DIAGNOSIS — Z20822 Contact with and (suspected) exposure to covid-19: Secondary | ICD-10-CM | POA: Diagnosis not present

## 2021-02-12 DIAGNOSIS — H5213 Myopia, bilateral: Secondary | ICD-10-CM | POA: Diagnosis not present

## 2021-02-12 DIAGNOSIS — H524 Presbyopia: Secondary | ICD-10-CM | POA: Diagnosis not present

## 2021-03-18 ENCOUNTER — Institutional Professional Consult (permissible substitution): Payer: Self-pay | Admitting: Neurology

## 2021-03-27 ENCOUNTER — Ambulatory Visit
Admission: RE | Admit: 2021-03-27 | Discharge: 2021-03-27 | Disposition: A | Discharge status: Home / Self Care | Payer: BC Managed Care – PPO | Source: Ambulatory Visit | Attending: Obstetrics and Gynecology | Admitting: Obstetrics and Gynecology

## 2021-03-27 ENCOUNTER — Other Ambulatory Visit: Payer: Self-pay

## 2021-03-27 DIAGNOSIS — Z1231 Encounter for screening mammogram for malignant neoplasm of breast: Secondary | ICD-10-CM | POA: Diagnosis not present

## 2021-03-29 DIAGNOSIS — M25562 Pain in left knee: Secondary | ICD-10-CM | POA: Diagnosis not present

## 2021-04-02 DIAGNOSIS — S83282D Other tear of lateral meniscus, current injury, left knee, subsequent encounter: Secondary | ICD-10-CM | POA: Diagnosis not present

## 2021-04-02 DIAGNOSIS — S83242A Other tear of medial meniscus, current injury, left knee, initial encounter: Secondary | ICD-10-CM | POA: Diagnosis not present

## 2021-04-17 DIAGNOSIS — G8918 Other acute postprocedural pain: Secondary | ICD-10-CM | POA: Diagnosis not present

## 2021-04-17 DIAGNOSIS — S83242A Other tear of medial meniscus, current injury, left knee, initial encounter: Secondary | ICD-10-CM | POA: Diagnosis not present

## 2021-04-17 DIAGNOSIS — X58XXXA Exposure to other specified factors, initial encounter: Secondary | ICD-10-CM | POA: Diagnosis not present

## 2021-04-17 DIAGNOSIS — M25762 Osteophyte, left knee: Secondary | ICD-10-CM | POA: Diagnosis not present

## 2021-04-17 DIAGNOSIS — M659 Synovitis and tenosynovitis, unspecified: Secondary | ICD-10-CM | POA: Diagnosis not present

## 2021-04-17 DIAGNOSIS — X503XXA Overexertion from repetitive movements, initial encounter: Secondary | ICD-10-CM | POA: Diagnosis not present

## 2021-04-17 DIAGNOSIS — M1712 Unilateral primary osteoarthritis, left knee: Secondary | ICD-10-CM | POA: Diagnosis not present

## 2021-04-17 DIAGNOSIS — M94262 Chondromalacia, left knee: Secondary | ICD-10-CM | POA: Diagnosis not present

## 2021-04-17 DIAGNOSIS — S83282A Other tear of lateral meniscus, current injury, left knee, initial encounter: Secondary | ICD-10-CM | POA: Diagnosis not present

## 2021-04-25 DIAGNOSIS — S83242D Other tear of medial meniscus, current injury, left knee, subsequent encounter: Secondary | ICD-10-CM | POA: Diagnosis not present

## 2021-04-25 DIAGNOSIS — S83282D Other tear of lateral meniscus, current injury, left knee, subsequent encounter: Secondary | ICD-10-CM | POA: Diagnosis not present

## 2021-06-13 DIAGNOSIS — M25562 Pain in left knee: Secondary | ICD-10-CM | POA: Diagnosis not present

## 2021-06-13 DIAGNOSIS — S83242D Other tear of medial meniscus, current injury, left knee, subsequent encounter: Secondary | ICD-10-CM | POA: Diagnosis not present

## 2021-06-13 DIAGNOSIS — S83282D Other tear of lateral meniscus, current injury, left knee, subsequent encounter: Secondary | ICD-10-CM | POA: Diagnosis not present

## 2021-06-14 DIAGNOSIS — E785 Hyperlipidemia, unspecified: Secondary | ICD-10-CM | POA: Diagnosis not present

## 2021-06-14 DIAGNOSIS — R739 Hyperglycemia, unspecified: Secondary | ICD-10-CM | POA: Diagnosis not present

## 2021-06-21 DIAGNOSIS — E785 Hyperlipidemia, unspecified: Secondary | ICD-10-CM | POA: Diagnosis not present

## 2021-06-21 DIAGNOSIS — R82998 Other abnormal findings in urine: Secondary | ICD-10-CM | POA: Diagnosis not present

## 2021-06-21 DIAGNOSIS — Z23 Encounter for immunization: Secondary | ICD-10-CM | POA: Diagnosis not present

## 2021-06-21 DIAGNOSIS — Z0000 Encounter for general adult medical examination without abnormal findings: Secondary | ICD-10-CM | POA: Diagnosis not present

## 2021-07-03 DIAGNOSIS — F1020 Alcohol dependence, uncomplicated: Secondary | ICD-10-CM | POA: Diagnosis not present

## 2021-07-03 DIAGNOSIS — F3341 Major depressive disorder, recurrent, in partial remission: Secondary | ICD-10-CM | POA: Diagnosis not present

## 2021-07-03 DIAGNOSIS — F341 Dysthymic disorder: Secondary | ICD-10-CM | POA: Diagnosis not present

## 2021-09-12 ENCOUNTER — Encounter: Payer: Self-pay | Admitting: *Deleted

## 2021-09-16 ENCOUNTER — Encounter: Payer: Self-pay | Admitting: Neurology

## 2021-09-16 ENCOUNTER — Ambulatory Visit: Payer: BC Managed Care – PPO | Admitting: Neurology

## 2021-09-16 VITALS — BP 120/78 | HR 89 | Ht 67.0 in | Wt 237.4 lb

## 2021-09-16 DIAGNOSIS — R0683 Snoring: Secondary | ICD-10-CM

## 2021-09-16 DIAGNOSIS — R635 Abnormal weight gain: Secondary | ICD-10-CM

## 2021-09-16 DIAGNOSIS — E669 Obesity, unspecified: Secondary | ICD-10-CM

## 2021-09-16 DIAGNOSIS — R351 Nocturia: Secondary | ICD-10-CM

## 2021-09-16 DIAGNOSIS — G4719 Other hypersomnia: Secondary | ICD-10-CM

## 2021-09-16 NOTE — Progress Notes (Signed)
Subjective:    Patient ID: Patricia Mendoza is a 65 y.o. female.  HPI    Huston Foley, MD, PhD [facility] [address] [address] [place], [place] [address]  Dear Dr. Timothy Lasso,   I saw your patient, Patricia Mendoza, upon your kind request in my sleep clinic today for initial consultation of sleep disorder, in particular, concern for underlying obstructive sleep apnea.  The patient is unaccompanied today. As you know, [name] is a 65 year old right-handed woman with an underlying medical history of gastritis, hiatal hernia, hypertension, hyperlipidemia, hyperglycemia, osteoarthritis, depression, anxiety, and obesity, who reports snoring and excessive daytime somnolence.  I reviewed the office note from 06/21/2021.  Her Epworth sleepiness score is 8/24, fatigue severity score is 33/63.  She is not aware of any family history of sleep apnea.  She lives alone.  She has a dog in the household, does not have a TV in her bedroom.  She works from home, she is Writer.  She sees orthopedics for left knee pain.  She is status post injections as well as arthroscopic knee surgery some 4 months ago.  She is followed by [facility].  She is on gabapentin as needed.  She also takes BuSpar and Effexor.  She has had weight gain over time.  She has nocturia about twice per average night, bedtime generally between 10 PM and 11 PM typically, sometimes as late as 11:30 PM.  Rise time is generally between 8 and 8:30 AM.  She has 1 grown son who is 47 years old.  She does take a nap during the day.  She has woken herself up with a sense of snorting.  She is a non-smoker and drinks alcohol about 3-4 times a week, up to 3 drinks at a time.  She drinks caffeine in the form of coffee, generally 2 cups of coffee in the morning and the occasional soda during the day.  Her Past Medical History Is Significant For: Past Medical History:  Diagnosis Date   Anxiety     Depression    Gastritis    Hiatal hernia    Hyperglycemia    Hyperlipidemia    Hypertension    Insomnia    OA (osteoarthritis)    Varicose veins of bilateral lower extremities with pain     Her Past Surgical History Is Significant For: Past Surgical History:  Procedure Laterality Date   DILATION AND CURETTAGE OF UTERUS     EAR SURGERY  1993   FOOT FRACTURE SURGERY  11/2002   hx of gum abscess, I&D     PATELLA FRACTURE SURGERY  11/17/2018   2 screws    Her Family History Is Significant For: Family History  Problem Relation Age of Onset   Osteoarthritis Mother    Memory loss Mother    Stomach cancer Father    CAD Father    Colon cancer Other     Her Social History Is Significant For: Social History   Socioeconomic History   Marital status: Divorced    Spouse name: Not on file   Number of children: Not on file   Years of education: Not on file   Highest education level: Not on file  Occupational History   Not on file  Tobacco Use   Smoking status: Former    Types: Cigarettes    Quit date: 11/08/2003    Years since quitting: 17.8   Smokeless tobacco: Never  Vaping Use  Vaping Use: Never used  Substance and Sexual Activity   Alcohol use: Yes    Comment: 3x/week 1-2 drinks   Drug use: Never   Sexual activity: Not on file  Other Topics Concern   Not on file  Social History Narrative   Caffeine 1-2 daily in am.     Social Determinants of Health   Financial Resource Strain: Not on file  Food Insecurity: Not on file  Transportation Needs: Not on file  Physical Activity: Not on file  Stress: Not on file  Social Connections: Not on file    Her Allergies Are:  No Known Allergies:   Her Current Medications Are:  Outpatient Encounter Medications as of 09/16/2021  Medication Sig   atorvastatin (LIPITOR) 80 MG tablet Take 80 mg by mouth daily.   busPIRone (BUSPAR) 30 MG tablet Take 40 mg by mouth daily.   Calcium Carb-Cholecalciferol (CALCIUM+D3 PO) Take  1 tablet by mouth daily.   Glucosamine HCl 1500 MG TABS Take 1,500 mg by mouth daily.   lisinopril-hydrochlorothiazide (PRINZIDE,ZESTORETIC) 20-12.5 MG tablet Take 1 tablet by mouth daily.   magnesium oxide (MAG-OX) 400 MG tablet Take 1,200 mg by mouth at bedtime.   venlafaxine XR (EFFEXOR-XR) 150 MG 24 hr capsule TAKE 2 CAPSULES BY MOUTH EVERY MORNING WITH FOOD   [DISCONTINUED] busPIRone (BUSPAR) 10 MG tablet Take 20 mg by mouth 1 day or 1 dose.    [DISCONTINUED] busPIRone (BUSPAR) 5 MG tablet 40 mg daily.   [DISCONTINUED] fluticasone (CUTIVATE) 0.05 % cream APPLY THIN LAYER TO FACE TWICE DAILY   [DISCONTINUED] fluticasone (FLONASE) 50 MCG/ACT nasal spray Place 2 sprays into both nostrils daily.   [DISCONTINUED] loratadine (CLARITIN) 10 MG tablet Take 10 mg by mouth daily.   No facility-administered encounter medications on file as of 09/16/2021.  :   Review of Systems:  Out of a complete 14 point review of systems, all are reviewed and negative with the exception of these symptoms as listed below:  Review of Systems  Neurological:        Snoring, waking herself with snort, fatigue, takes daytime nap. ESS: 8, FSS 33.   Objective:  Neurological Exam  Physical Exam Physical Examination:   Vitals:   09/16/21 1245  BP: 120/78  Pulse: 89    General Examination: The patient is a very pleasant 65 y.o. female in no acute distress. She appears well-developed and well-nourished and well groomed.   HEENT: Normocephalic, atraumatic, pupils are equal, round and reactive to light, extraocular tracking is good without limitation to gaze excursion or nystagmus noted. Hearing is grossly intact. Face is symmetric with normal facial animation. Speech is clear with no dysarthria noted. There is no hypophonia. There is no lip, neck/head, jaw or voice tremor. Neck is supple with full range of passive and active motion. There are no carotid bruits on auscultation. Oropharynx exam reveals: mild mouth  dryness, adequate dental hygiene and moderate airway crowding, due to small airway entry, tonsils on the smaller side, uvula also not enlarged, Mallampati class IV.  Neck circumference of 16-7/8 inches.  She has a minimal to mild overbite.  Tongue protrudes centrally and palate elevates symmetrically.  Chest: Clear to auscultation without wheezing, rhonchi or crackles noted.  Heart: S1+S2+0, regular and normal without murmurs, rubs or gallops noted.   Abdomen: Soft, non-tender and non-distended with normal bowel sounds appreciated on auscultation.  Extremities: There is no pitting edema in the distal lower extremities bilaterally.   Skin: Warm and dry without  trophic changes noted.  Varicose veins distal lower extremities bilaterally.  Musculoskeletal: exam reveals no obvious joint deformities, but reports knee discomfort on the left.   Neurologically:  Mental status: The patient is awake, alert and oriented in all 4 spheres. Her immediate and remote memory, attention, language skills and fund of knowledge are appropriate. There is no evidence of aphasia, agnosia, apraxia or anomia. Speech is clear with normal prosody and enunciation. Thought process is linear. Mood is normal and affect is normal.  Cranial nerves II - XII are as described above under HEENT exam.  Motor exam: Normal bulk, strength and tone is noted. There is no tremor, fine motor skills and coordination: grossly intact.  Cerebellar testing: No dysmetria or intention tremor. There is no truncal or gait ataxia.  Sensory exam: intact to light touch in the upper and lower extremities.  Gait, station and balance: She stands with mild difficulty and pushes herself up.  She walks initially with a mild limp on the left.  No walking aid.   Assessment and Plan:  In summary, Jariana W Prindiville is a very pleasant 65 y.o.-year old female with an underlying medical history of gastritis, hiatal hernia, hypertension, hyperlipidemia,  hyperglycemia, osteoarthritis, depression, anxiety, and obesity, whose history and physical exam are concerning for obstructive sleep apnea (OSA). I had a long chat with the patient about my findings and the diagnosis of OSA, its prognosis and treatment options. We talked about medical treatments, surgical interventions and non-pharmacological approaches. I explained in particular the risks and ramifications of untreated moderate to severe OSA, especially with respect to developing cardiovascular disease down the Road, including congestive heart failure, difficult to treat hypertension, cardiac arrhythmias, or stroke. Even type 2 diabetes has, in part, been linked to untreated OSA. Symptoms of untreated OSA include daytime sleepiness, memory problems, mood irritability and mood disorder such as depression and anxiety, lack of energy, as well as recurrent headaches, especially morning headaches. We talked about trying to maintain a healthy lifestyle in general, as well as the importance of weight control. We also talked about the importance of good sleep hygiene. I recommended the following at this time: sleep study.  I have outlined the difference between a laboratory attended sleep study versus home sleep test.  I explained the sleep test procedure to the patient and also outlined possible surgical and non-surgical treatment options of OSA, including the use of a custom-made dental device (which would require a referral to a specialist dentist or oral surgeon), upper airway surgical options, such as traditional UPPP or a novel less invasive surgical option in the form of Inspire hypoglossal nerve stimulation (which would involve a referral to an ENT surgeon). I also explained the CPAP treatment option to the patient, who indicated that she would be willing to try CPAP or AutoPap if the need arises.  We will pick up our discussion after testing and plan to follow-up in this clinic accordingly.  I answered all  her questions today and she was in agreement.  Thank you very much for allowing me to participate in the care of this nice patient. If I can be of any further assistance to you please do not hesitate to call me at [phone].  Sincerely,   Huston Foley, MD, PhD

## 2021-09-16 NOTE — Patient Instructions (Signed)

## 2021-09-17 DIAGNOSIS — M1712 Unilateral primary osteoarthritis, left knee: Secondary | ICD-10-CM | POA: Diagnosis not present

## 2021-09-17 DIAGNOSIS — M545 Low back pain, unspecified: Secondary | ICD-10-CM | POA: Diagnosis not present

## 2021-09-17 DIAGNOSIS — S83242A Other tear of medial meniscus, current injury, left knee, initial encounter: Secondary | ICD-10-CM | POA: Diagnosis not present

## 2021-09-20 ENCOUNTER — Other Ambulatory Visit: Payer: Self-pay

## 2021-09-20 ENCOUNTER — Encounter (HOSPITAL_BASED_OUTPATIENT_CLINIC_OR_DEPARTMENT_OTHER): Payer: Self-pay | Admitting: Physical Therapy

## 2021-09-20 ENCOUNTER — Ambulatory Visit (HOSPITAL_BASED_OUTPATIENT_CLINIC_OR_DEPARTMENT_OTHER): Payer: BC Managed Care – PPO | Attending: Orthopedic Surgery | Admitting: Physical Therapy

## 2021-09-20 DIAGNOSIS — M25562 Pain in left knee: Secondary | ICD-10-CM | POA: Insufficient documentation

## 2021-09-20 DIAGNOSIS — G8929 Other chronic pain: Secondary | ICD-10-CM | POA: Insufficient documentation

## 2021-09-20 NOTE — Therapy (Signed)
OUTPATIENT PHYSICAL THERAPY LOWER EXTREMITY EVALUATION   Patient Name: Patricia Mendoza MRN: [id number] DOB:13-Dec-1955, 65 y.o., female Today's Date: 09/20/2021   PT End of Session - 09/20/21 0856     Visit Number 1    Number of Visits 17    Date for PT Re-Evaluation 11/15/21    Authorization Type BCBS    PT Start Time [phone]    PT Stop Time 0930    PT Time Calculation (min) 38 min    Activity Tolerance Patient tolerated treatment well    Behavior During Therapy WFL for tasks assessed/performed             Past Medical History:  Diagnosis Date   Anxiety    Depression    Gastritis    Hiatal hernia    Hyperglycemia    Hyperlipidemia    Hypertension    Insomnia    OA (osteoarthritis)    Varicose veins of bilateral lower extremities with pain    Past Surgical History:  Procedure Laterality Date   DILATION AND CURETTAGE OF UTERUS     EAR SURGERY  1993   FOOT FRACTURE SURGERY  11/2002   hx of gum abscess, I&D     PATELLA FRACTURE SURGERY  11/17/2018   2 screws   There are no problems to display for this patient.   PCP: Creola Corn, MD  REFERRING PROVIDER: Salvatore Marvel, MD  REFERRING DIAG: s/p Lt knee scope, OA  THERAPY DIAG:  Chronic pain of left knee  ONSET DATE: 04/17/21  SUBJECTIVE:   SUBJECTIVE STATEMENT: 2019 broke knee cap which was corrected. Last Oct I tore my meniscus at a wedding. Did the scope to address in May. I still have discomfort daily on a scale from 3-6. Shooting pain on bilat aspects and post. I do pilates 2/week. Did not every do PT after surgery. Saw him 10/11 and Rx 12d of prednisone. After Market we are going to do a series of gel injections. Working on weight loss- I lost 17 and gained 10 back. I can stand but spend a lot of time shifted to the Rt side.   PERTINENT HISTORY: Lt foot fx surgery in 2003, L4-5 arthritis and buldging  PAIN:  Are you having pain? Yes VAS scale: 3-6/10 Pain location: Lt knee Pain orientation: Left   PAIN TYPE: sharp and catching Pain description: intermittent  Aggravating factors: weight bearing Relieving factors: ice, voltaren  PRECAUTIONS: None  WEIGHT BEARING RESTRICTIONS No  FALLS:  Has patient fallen in last 6 months? 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[employer], sells luxury vintage goods  PLOF: Independent  PATIENT GOALS decrease knee pain, stop favoring Rt side, balance out gait, improve standing tolerance, improve strength   OBJECTIVE:    PATIENT SURVEYS:  LEFS 43/80  COGNITION:  Overall cognitive status: Within functional limits for tasks assessed     SENSATION:  WFL  MUSCLE LENGTH: Good flexibility bilaterally  POSTURE:  Mild incr in kyphosis, good LE alignment  LE AROM/PROM:  WFL  LE MMT:  Gross hip strength 5/5 bilat Unable to demo pelvic alignment in SLS    GAIT:  Comments: mild Rt trendelenburg in stance phase    TODAY'S TREATMENT: SLS with level pelvis- prog to slow walking with hip abd activation   PATIENT EDUCATION:  Education details: Anatomy of condition, POC, HEP, exercise  form/rationale, aquatics  Person educated: Patient Education method: Explanation, Demonstration, Tactile cues, Verbal cues, and Handouts Education comprehension: verbalized understanding, returned demonstration, verbal cues required, tactile cues required, and needs further education   HOME EXERCISE PROGRAM: SLS with level pelvis  ASSESSMENT:  CLINICAL IMPRESSION: Patient is a 65 y.o. F who was seen today for physical therapy evaluation and treatment for Lt knee pain s/p scope on 04/17/21. Objective impairments include Abnormal gait, decreased activity tolerance, difficulty walking, and pain. These impairments are limiting patient from cleaning, community activity, occupation, and yard work.  Personal factors including 3+ comorbidities: knee surgery x2, h/o foot fracture, known lumbar arthritis with h/o sciatica  are also affecting patient's functional outcome. Patient will benefit from skilled PT to address above impairments and improve overall function.  Pt has good straight plane strength and does pilates regularly for strengthening. Aquatic therapy will be beneficial to remove some force from hip and knee joint to improve functional endurance and decrease pain.   REHAB POTENTIAL: Good  CLINICAL DECISION MAKING: Evolving/moderate complexity  EVALUATION COMPLEXITY: Moderate   GOALS: Goals reviewed with patient? Yes  SHORT TERM GOALS:  STG Name Target Date Goal status  1 Resolution of trendelenburg gait pattern Baseline: mild at eval 10/11/2021 INITIAL                                 LONG TERM GOALS:   LTG Name Target Date Goal status  1 Tolerate standing for market Baseline: 11/15/2021 INITIAL  2 Pt will report reduced use of shifted posture to RLE as compensation Baseline: 11/15/2021 INITIAL  3 Knee pain to <=3/10 during functional daily activities Baseline:ranges from 3-6 on average with increases due to activity 11/15/2021 INITIAL  4 Pt will only require knee ice 1/day for pain control Baseline:multiple a day requried at eval 11/15/2021 INITIAL  5 Pt will be able to navigate stairs with max of mild discomfort Baseline: significant at eval 11/15/21 Initial   PT FREQUENCY: 1-2x/week   will adapt for changes in work schedule  PT DURATION: 8 weeks  PLANNED INTERVENTIONS: Therapeutic exercises, Therapeutic activity, Neuro Muscular re-education, Balance training, Gait training, Patient/Family education, Joint mobilization, Stair training, Aquatic Therapy, Dry Needling, Cryotherapy, Moist heat, and Manual therapy  PLAN FOR NEXT SESSION: begin aquatics- pilates approaches with core focus, high level balance challenges- mindfulness during exercise  Gennifer Potenza C.  Dearl Rudden PT, DPT 09/20/21 9:44 AM

## 2021-09-25 ENCOUNTER — Telehealth: Payer: Self-pay

## 2021-09-25 NOTE — Telephone Encounter (Signed)
LVM for pt to call me back to schedule sleep study  

## 2021-10-01 ENCOUNTER — Encounter (HOSPITAL_BASED_OUTPATIENT_CLINIC_OR_DEPARTMENT_OTHER): Payer: BC Managed Care – PPO | Attending: Physical Therapy | Admitting: Physical Therapy

## 2021-10-03 ENCOUNTER — Ambulatory Visit (HOSPITAL_BASED_OUTPATIENT_CLINIC_OR_DEPARTMENT_OTHER): Payer: BC Managed Care – PPO | Admitting: Physical Therapy

## 2021-10-08 ENCOUNTER — Ambulatory Visit (HOSPITAL_BASED_OUTPATIENT_CLINIC_OR_DEPARTMENT_OTHER): Payer: BC Managed Care – PPO | Admitting: Physical Therapy

## 2021-10-08 ENCOUNTER — Encounter (HOSPITAL_BASED_OUTPATIENT_CLINIC_OR_DEPARTMENT_OTHER): Payer: Self-pay | Admitting: Physical Therapy

## 2021-10-10 ENCOUNTER — Other Ambulatory Visit: Payer: Self-pay

## 2021-10-10 ENCOUNTER — Ambulatory Visit (HOSPITAL_BASED_OUTPATIENT_CLINIC_OR_DEPARTMENT_OTHER): Payer: BC Managed Care – PPO | Attending: Orthopedic Surgery | Admitting: Physical Therapy

## 2021-10-10 DIAGNOSIS — R293 Abnormal posture: Secondary | ICD-10-CM | POA: Insufficient documentation

## 2021-10-10 DIAGNOSIS — R2681 Unsteadiness on feet: Secondary | ICD-10-CM | POA: Insufficient documentation

## 2021-10-10 DIAGNOSIS — M25561 Pain in right knee: Secondary | ICD-10-CM | POA: Diagnosis not present

## 2021-10-10 DIAGNOSIS — M25562 Pain in left knee: Secondary | ICD-10-CM | POA: Insufficient documentation

## 2021-10-10 DIAGNOSIS — R2689 Other abnormalities of gait and mobility: Secondary | ICD-10-CM | POA: Insufficient documentation

## 2021-10-10 DIAGNOSIS — R262 Difficulty in walking, not elsewhere classified: Secondary | ICD-10-CM | POA: Insufficient documentation

## 2021-10-10 DIAGNOSIS — M6281 Muscle weakness (generalized): Secondary | ICD-10-CM | POA: Insufficient documentation

## 2021-10-10 DIAGNOSIS — G8929 Other chronic pain: Secondary | ICD-10-CM | POA: Diagnosis not present

## 2021-10-10 NOTE — Therapy (Signed)
OUTPATIENT PHYSICAL THERAPY TREATMENT NOTE   Patient Name: Patricia Mendoza MRN: [id number] DOB:June 07, 1956, 65 y.o., female Today's Date: 10/11/2021  PCP: Creola Corn, MD REFERRING PROVIDER: Salvatore Marvel, MD   PT End of Session - 10/10/21 1610     Visit Number 2    Number of Visits 17    Date for PT Re-Evaluation 11/15/21    Authorization Type BCBS    PT Start Time 1610    PT Stop Time 1655    PT Time Calculation (min) 45 min    Activity Tolerance Patient tolerated treatment well    Behavior During Therapy WFL for tasks assessed/performed             Past Medical History:  Diagnosis Date   Anxiety    Depression    Gastritis    Hiatal hernia    Hyperglycemia    Hyperlipidemia    Hypertension    Insomnia    OA (osteoarthritis)    Varicose veins of bilateral lower extremities with pain    Past Surgical History:  Procedure Laterality Date   DILATION AND CURETTAGE OF UTERUS     EAR SURGERY  1993   FOOT FRACTURE SURGERY  11/2002   hx of gum abscess, I&D     PATELLA FRACTURE SURGERY  11/17/2018   2 screws   There are no problems to display for this patient.   REFERRING DIAG: s/p Lt knee scope, OA  THERAPY DIAG:  Chronic pain of left knee  PERTINENT HISTORY: Lt foot fx surgery in 2003, L4-5 arthritis and buldging  PRECAUTIONS: none  SUBJECTIVE: I had market and then the flu so I have no stamina. Did a course of prednisone.   PAIN:  Are you having pain? No VAS scale: 3/10 with stairs Pain location: Lt knee  Aggravating factors: stairs Relieving factors: rest    OBJECTIVE:  PATIENT SURVEYS:  LEFS 43/80   COGNITION:          Overall cognitive status: Within functional limits for tasks assessed                        SENSATION:          WFL   MUSCLE LENGTH: Good flexibility bilaterally   POSTURE:  Mild incr in kyphosis, good LE alignment   LE AROM/PROM:   WFL   LE MMT:   Gross hip strength 5/5 bilat Unable to demo pelvic  alignment in SLS       GAIT:   Comments: mild Rt trendelenburg in stance phase       TODAY'S TREATMENT:  11/3 AquaticREHABdocumentation: Water will reduce edema around joints through hydrostatic pressure that allows for improved ROM, Water will allow for reduced gait deviation due to reduced joint loading through buoyancy to help patient improve posture without excess stress and pain. , Water will aid with movement using the current and laminar flow while the buoyancy reduces weight bearing, and Hydrostatic pressure also supports joints by unweighting joint load by at least 50 % in 3-4 feet depth water. 80% in chest to neck deep water.   Walking for warmup  Wall sit at side of pool: noodle behind knee press downs, noodle behind ankle: horiz abd/add HS stretch, long axis circles  Facing wall: quad stretch & knee flexion stretch with noodle under dorsum of foot, HSS foot on wall  Plank series, cuffs on ankle, belt under waist: alt hip flexion, alt knee pull  Floating V  series: iso holds, LE abd/add, UE horiz abd/add  SLS with hip abd- speed for resistance from water    Eval SLS with level pelvis- prog to slow walking with hip abd activation     PATIENT EDUCATION:  Education details: exercise form/rationale   Person educated: Patient Education method: Explanation, Demonstration, Tactile cues, Verbal cues, and Handouts Education comprehension: verbalized understanding, returned demonstration, verbal cues required, tactile cues required, and needs further education     HOME EXERCISE PROGRAM: [id number] SLS with level pelvis   ASSESSMENT:   CLINICAL IMPRESSION: Focused on engagement of core for central stability to improve distal control. Advised pt that she is working harder than she thinks in the pool with aggitation of water. She has been doing pilates so she is aware of the need for awareness of core engagement. Denied increase in knee pain with exercises today and tolerated  well in regards to fatigue post flu.      REHAB POTENTIAL: Good   CLINICAL DECISION MAKING: Evolving/moderate complexity   EVALUATION COMPLEXITY: Moderate     GOALS: Goals reviewed with patient? Yes   SHORT TERM GOALS:   STG Name Target Date Goal status  1 Resolution of trendelenburg gait pattern Baseline: mild at eval 10/11/2021 achieved                                                          LONG TERM GOALS:    LTG Name Target Date Goal status  1 Tolerate standing for market Baseline: 11/15/2021 INITIAL  2 Pt will report reduced use of shifted posture to RLE as compensation Baseline: 11/15/2021 INITIAL  3 Knee pain to <=3/10 during functional daily activities Baseline:ranges from 3-6 on average with increases due to activity 11/15/2021 INITIAL  4 Pt will only require knee ice 1/day for pain control Baseline:multiple a day requried at eval 11/15/2021 INITIAL  5 Pt will be able to navigate stairs with max of mild discomfort Baseline: significant at eval 11/15/21 Initial    PT FREQUENCY: 1-2x/week   will adapt for changes in work schedule   PT DURATION: 8 weeks   PLANNED INTERVENTIONS: Therapeutic exercises, Therapeutic activity, Neuro Muscular re-education, Balance training, Gait training, Patient/Family education, Joint mobilization, Stair training, Aquatic Therapy, Dry Needling, Cryotherapy, Moist heat, and Manual therapy   PLAN FOR NEXT SESSION: continue aquatics- pilates approaches with core focus, high level balance challenges- mindfulness during exercise    Kathleen Tamm C. Kaimani Clayson PT, DPT 10/11/21 8:10 AM

## 2021-10-11 ENCOUNTER — Encounter (HOSPITAL_BASED_OUTPATIENT_CLINIC_OR_DEPARTMENT_OTHER): Payer: Self-pay | Admitting: Physical Therapy

## 2021-10-14 ENCOUNTER — Ambulatory Visit (INDEPENDENT_AMBULATORY_CARE_PROVIDER_SITE_OTHER): Payer: BC Managed Care – PPO | Admitting: Neurology

## 2021-10-14 DIAGNOSIS — G4733 Obstructive sleep apnea (adult) (pediatric): Secondary | ICD-10-CM | POA: Diagnosis not present

## 2021-10-14 DIAGNOSIS — R635 Abnormal weight gain: Secondary | ICD-10-CM

## 2021-10-14 DIAGNOSIS — E669 Obesity, unspecified: Secondary | ICD-10-CM

## 2021-10-14 DIAGNOSIS — G4719 Other hypersomnia: Secondary | ICD-10-CM

## 2021-10-14 DIAGNOSIS — G4734 Idiopathic sleep related nonobstructive alveolar hypoventilation: Secondary | ICD-10-CM

## 2021-10-14 DIAGNOSIS — R0683 Snoring: Secondary | ICD-10-CM

## 2021-10-14 DIAGNOSIS — R351 Nocturia: Secondary | ICD-10-CM

## 2021-10-14 NOTE — Therapy (Incomplete)
OUTPATIENT PHYSICAL THERAPY TREATMENT NOTE   Patient Name: Patricia Mendoza MRN: [id number] DOB:05/14/1956, 64 y.o., female Today's Date: 10/14/2021  PCP: Creola Corn, MD REFERRING PROVIDER: Salvatore Marvel, MD     Past Medical History:  Diagnosis Date   Anxiety    Depression    Gastritis    Hiatal hernia    Hyperglycemia    Hyperlipidemia    Hypertension    Insomnia    OA (osteoarthritis)    Varicose veins of bilateral lower extremities with pain    Past Surgical History:  Procedure Laterality Date   DILATION AND CURETTAGE OF UTERUS     EAR SURGERY  1993   FOOT FRACTURE SURGERY  11/2002   hx of gum abscess, I&D     PATELLA FRACTURE SURGERY  11/17/2018   2 screws   There are no problems to display for this patient.   REFERRING DIAG: s/p Lt knee scope, OA  THERAPY DIAG:  No diagnosis found.  PERTINENT HISTORY: Lt foot fx surgery in 2003, L4-5 arthritis and buldging  PRECAUTIONS: none  SUBJECTIVE: ***  PAIN:  Are you having pain? No VAS scale: 3/10 with stairs Pain location: Lt knee  Aggravating factors: stairs Relieving factors: rest    OBJECTIVE:  PATIENT SURVEYS:  LEFS 43/80   COGNITION:          Overall cognitive status: Within functional limits for tasks assessed                        SENSATION:          WFL   MUSCLE LENGTH: Good flexibility bilaterally   POSTURE:  Mild incr in kyphosis, good LE alignment   LE AROM/PROM:   WFL   LE MMT:   Gross hip strength 5/5 bilat Unable to demo pelvic alignment in SLS       GAIT:   Comments: mild Rt trendelenburg in stance phase       TODAY'S TREATMENT: 11/8  AquaticREHABdocumentation: Water will reduce edema around joints through hydrostatic pressure that allows for improved ROM, Water will allow for reduced gait deviation due to reduced joint loading through buoyancy to help patient improve posture without excess stress and pain. , Water will aid with movement using the current  and laminar flow while the buoyancy reduces weight bearing, and Hydrostatic pressure also supports joints by unweighting joint load by at least 50 % in 3-4 feet depth water. 80% in chest to neck deep water.  11/3 AquaticREHABdocumentation: Water will reduce edema around joints through hydrostatic pressure that allows for improved ROM, Water will allow for reduced gait deviation due to reduced joint loading through buoyancy to help patient improve posture without excess stress and pain. , Water will aid with movement using the current and laminar flow while the buoyancy reduces weight bearing, and Hydrostatic pressure also supports joints by unweighting joint load by at least 50 % in 3-4 feet depth water. 80% in chest to neck deep water.   Walking for warmup  Wall sit at side of pool: noodle behind knee press downs, noodle behind ankle: horiz abd/add HS stretch, long axis circles  Facing wall: quad stretch & knee flexion stretch with noodle under dorsum of foot, HSS foot on wall  Plank series, cuffs on ankle, belt under waist: alt hip flexion, alt knee pull  Floating V series: iso holds, LE abd/add, UE horiz abd/add  SLS with hip abd- speed for resistance from water  Eval SLS with level pelvis- prog to slow walking with hip abd activation     PATIENT EDUCATION:  Education details: exercise form/rationale   Person educated: Patient Education method: Explanation, Demonstration, Tactile cues, Verbal cues, and Handouts Education comprehension: verbalized understanding, returned demonstration, verbal cues required, tactile cues required, and needs further education     HOME EXERCISE PROGRAM: [id number] SLS with level pelvis   ASSESSMENT:   CLINICAL IMPRESSION: ***  Focused on engagement of core for central stability to improve distal control. Advised pt that she is working harder than she thinks in the pool with aggitation of water. She has been doing pilates so she is aware of the need  for awareness of core engagement. Denied increase in knee pain with exercises today and tolerated well in regards to fatigue post flu.      REHAB POTENTIAL: Good   CLINICAL DECISION MAKING: Evolving/moderate complexity   EVALUATION COMPLEXITY: Moderate     GOALS: Goals reviewed with patient? Yes   SHORT TERM GOALS:   STG Name Target Date Goal status  1 Resolution of trendelenburg gait pattern Baseline: mild at eval 10/11/2021 achieved                                                          LONG TERM GOALS:    LTG Name Target Date Goal status  1 Tolerate standing for market Baseline: 11/15/2021 INITIAL  2 Pt will report reduced use of shifted posture to RLE as compensation Baseline: 11/15/2021 INITIAL  3 Knee pain to <=3/10 during functional daily activities Baseline:ranges from 3-6 on average with increases due to activity 11/15/2021 INITIAL  4 Pt will only require knee ice 1/day for pain control Baseline:multiple a day requried at eval 11/15/2021 INITIAL  5 Pt will be able to navigate stairs with max of mild discomfort Baseline: significant at eval 11/15/21 Initial    PT FREQUENCY: 1-2x/week   will adapt for changes in work schedule   PT DURATION: 8 weeks   PLANNED INTERVENTIONS: Therapeutic exercises, Therapeutic activity, Neuro Muscular re-education, Balance training, Gait training, Patient/Family education, Joint mobilization, Stair training, Aquatic Therapy, Dry Needling, Cryotherapy, Moist heat, and Manual therapy   PLAN FOR NEXT SESSION: continue aquatics- pilates approaches with core focus, high level balance challenges- mindfulness during exercise    Trell Secrist C. Khaniyah Bezek PT, DPT 10/14/21 8:57 PM

## 2021-10-15 ENCOUNTER — Ambulatory Visit (HOSPITAL_BASED_OUTPATIENT_CLINIC_OR_DEPARTMENT_OTHER): Payer: BC Managed Care – PPO | Admitting: Physical Therapy

## 2021-10-15 ENCOUNTER — Encounter (HOSPITAL_BASED_OUTPATIENT_CLINIC_OR_DEPARTMENT_OTHER): Payer: Self-pay

## 2021-10-15 DIAGNOSIS — M1712 Unilateral primary osteoarthritis, left knee: Secondary | ICD-10-CM | POA: Diagnosis not present

## 2021-10-16 NOTE — Progress Notes (Signed)
See procedure note.

## 2021-10-16 NOTE — Procedures (Signed)
     [facility]  HOME SLEEP TEST (Watch PAT) REPORT  STUDY DATE: 10/14/2021  DOB: Jan 18, 1956  MRN: [id number]  ORDERING CLINICIAN: Huston Foley, MD, PhD   REFERRING CLINICIAN: Creola Corn, MD   CLINICAL INFORMATION/HISTORY: 65 year old right-handed woman with an underlying medical history of gastritis, hiatal hernia, hypertension, hyperlipidemia, hyperglycemia, osteoarthritis, depression, anxiety, and obesity, who reports snoring and excessive daytime somnolence.   Epworth sleepiness score: 8/24.  BMI: 37.4 kg/m  FINDINGS:   Sleep Summary:   Total Recording Time (hours, min): 8 hours, 54 minutes  Total Sleep Time (hours, min):  6 hours 55 minutes   Percent REM (%):    20.2%   Respiratory Indices:   Calculated pAHI (per hour):  43/hour         REM pAHI:    53.8/hour       NREM pAHI: 40.3/hour  Oxygen Saturation Statistics:    Oxygen Saturation (%) Mean: 90%   Minimum oxygen saturation (%):                 83%   O2 Saturation Range (%): 83-98%    O2 Saturation (minutes) <=88%: 91.9 min  Pulse Rate Statistics:   Pulse Mean (bpm):    82/min    Pulse Range (48-118/min)   IMPRESSION: OSA (obstructive sleep apnea), severe Nocturnal hypoxemia RECOMMENDATION:  This home sleep test demonstrates severe obstructive sleep apnea with a total AHI of 43/hour and O2 nadir of 83% with evidence of nocturnal hypoxemia and time below 89% saturation of over 90 minutes of the night. Treatment with positive airway pressure is highly recommended. This will require - ideally - a full night CPAP titration study for proper treatment settings, O2 monitoring and mask fitting. For now, the patient will be advised to proceed with an autoPAP titration/trial at home.  A laboratory attended titration study can be considered in the future for optimization of his treatment and better tolerance of therapy.  Alternative treatment options are limited secondary to the severity of  the patient's sleep disordered breathing.  Concomitant weight loss is highly recommended.  Please note, that untreated obstructive sleep apnea may carry additional perioperative morbidity. Patients with significant obstructive sleep apnea should receive perioperative PAP therapy and the surgeons and particularly the anesthesiologist should be informed of the diagnosis and the severity of the sleep disordered breathing. The patient should be cautioned not to drive, work at heights, or operate dangerous or heavy equipment when tired or sleepy. Review and reiteration of good sleep hygiene measures should be pursued with any patient. Other causes of the patient's symptoms, including circadian rhythm disturbances, an underlying mood disorder, medication effect and/or an underlying medical problem cannot be ruled out based on this test. Clinical correlation is recommended. The patient and her referring provider will be notified of the test results. The patient will be seen in follow up in sleep clinic at [facility].  I certify that I have reviewed the raw data recording prior to the issuance of this report in accordance with the standards of the American Academy of Sleep Medicine (AASM).  INTERPRETING PHYSICIAN:   Huston Foley, MD, PhD  Board Certified in Neurology and Sleep Medicine  [facility] [address], [address] [place], [place] [address] [phone]

## 2021-10-16 NOTE — Addendum Note (Signed)
Addended by: Huston Foley on: 10/16/2021 05:37 PM   Modules accepted: Orders

## 2021-10-17 ENCOUNTER — Ambulatory Visit (HOSPITAL_BASED_OUTPATIENT_CLINIC_OR_DEPARTMENT_OTHER): Payer: BC Managed Care – PPO | Admitting: Physical Therapy

## 2021-10-22 ENCOUNTER — Encounter (HOSPITAL_BASED_OUTPATIENT_CLINIC_OR_DEPARTMENT_OTHER): Payer: Self-pay | Admitting: Physical Therapy

## 2021-10-22 ENCOUNTER — Other Ambulatory Visit: Payer: Self-pay

## 2021-10-22 ENCOUNTER — Ambulatory Visit (HOSPITAL_BASED_OUTPATIENT_CLINIC_OR_DEPARTMENT_OTHER): Payer: BC Managed Care – PPO | Admitting: Physical Therapy

## 2021-10-22 DIAGNOSIS — M6281 Muscle weakness (generalized): Secondary | ICD-10-CM

## 2021-10-22 DIAGNOSIS — R2689 Other abnormalities of gait and mobility: Secondary | ICD-10-CM

## 2021-10-22 DIAGNOSIS — R262 Difficulty in walking, not elsewhere classified: Secondary | ICD-10-CM

## 2021-10-22 DIAGNOSIS — R293 Abnormal posture: Secondary | ICD-10-CM | POA: Diagnosis not present

## 2021-10-22 DIAGNOSIS — M25561 Pain in right knee: Secondary | ICD-10-CM | POA: Diagnosis not present

## 2021-10-22 DIAGNOSIS — R2681 Unsteadiness on feet: Secondary | ICD-10-CM | POA: Diagnosis not present

## 2021-10-22 DIAGNOSIS — M25562 Pain in left knee: Secondary | ICD-10-CM | POA: Diagnosis not present

## 2021-10-22 DIAGNOSIS — G8929 Other chronic pain: Secondary | ICD-10-CM | POA: Diagnosis not present

## 2021-10-22 DIAGNOSIS — M1712 Unilateral primary osteoarthritis, left knee: Secondary | ICD-10-CM | POA: Diagnosis not present

## 2021-10-22 NOTE — Therapy (Signed)
OUTPATIENT PHYSICAL THERAPY TREATMENT NOTE   Patient Name: Patricia Mendoza MRN: [id number] DOB:06-29-1956, 65 y.o., female Today's Date: 10/22/2021  PCP: Creola Corn, MD REFERRING PROVIDER: Salvatore Marvel, MD   PT End of Session - 10/22/21 0905     Visit Number 3    Number of Visits 17    Date for PT Re-Evaluation 11/15/21    Authorization Type BCBS    PT Start Time 0900    PT Stop Time 0945    PT Time Calculation (min) 45 min    Activity Tolerance Patient tolerated treatment well    Behavior During Therapy WFL for tasks assessed/performed             Past Medical History:  Diagnosis Date   Anxiety    Depression    Gastritis    Hiatal hernia    Hyperglycemia    Hyperlipidemia    Hypertension    Insomnia    OA (osteoarthritis)    Varicose veins of bilateral lower extremities with pain    Past Surgical History:  Procedure Laterality Date   DILATION AND CURETTAGE OF UTERUS     EAR SURGERY  1993   FOOT FRACTURE SURGERY  11/2002   hx of gum abscess, I&D     PATELLA FRACTURE SURGERY  11/17/2018   2 screws   There are no problems to display for this patient.   REFERRING DIAG: s/p Lt knee scope, OA  THERAPY DIAG:  Abnormal posture  Chronic pain of right knee  Difficulty in walking, not elsewhere classified  Muscle weakness (generalized)  Unsteadiness on feet  Other abnormalities of gait and mobility  PERTINENT HISTORY: Lt foot fx surgery in 2003, L4-5 arthritis and buldging  PRECAUTIONS: none  SUBJECTIVE: I am feeling better. Have an appointment with Dr Wyline Mood for gel shot today"  PAIN:  Are you having pain? No VAS scale: 3/10 with stairs 11/15 Pain location: Lt knee  Aggravating factors: stairs Relieving factors: rest    OBJECTIVE:  PATIENT SURVEYS:  LEFS 43/80   COGNITION:          Overall cognitive status: Within functional limits for tasks assessed                        SENSATION:          WFL   MUSCLE LENGTH: Good  flexibility bilaterally   POSTURE:  Mild incr in kyphosis, good LE alignment   LE AROM/PROM:   WFL   LE MMT:   Gross hip strength 5/5 bilat Unable to demo pelvic alignment in SLS       GAIT:   Comments: mild Rt trendelenburg in stance phase       TODAY'S TREATMENT:  11/15 AquaticREHABdocumentation: Water will reduce edema around joints through hydrostatic pressure that allows for improved ROM, Water will allow for reduced gait deviation due to reduced joint loading through buoyancy to help patient improve posture without excess stress and pain. , Water will aid with movement using the current and laminar flow while the buoyancy reduces weight bearing, and Hydrostatic pressure also supports joints by unweighting joint load by at least 50 % in 3-4 feet depth water. 80% in chest to neck deep water.  Water walking warm up forward, back and side x 6 widths Seated stretching hamstrings, gastroc, glute and adductors Long leg hip flex slow 2 x 10, quick x 10 Long leg hip extension 2 x 10 slow Long leg add x  10 slow Step ups submerged to chest x10 R/L forward and side stepping Pilates: lat pull 2 x10; swimming x10;plank x3 holding for up to 30 seconds; reverse plank multiple attempts to gain position  11/3 AquaticREHABdocumentation: Water will reduce edema around joints through hydrostatic pressure that allows for improved ROM, Water will allow for reduced gait deviation due to reduced joint loading through buoyancy to help patient improve posture without excess stress and pain. , Water will aid with movement using the current and laminar flow while the buoyancy reduces weight bearing, and Hydrostatic pressure also supports joints by unweighting joint load by at least 50 % in 3-4 feet depth water. 80% in chest to neck deep water.   Walking for warmup  Wall sit at side of pool: noodle behind knee press downs, noodle behind ankle: horiz abd/add HS stretch, long axis circles  Facing wall:  quad stretch & knee flexion stretch with noodle under dorsum of foot, HSS foot on wall  Plank series, cuffs on ankle, belt under waist: alt hip flexion, alt knee pull  Floating V series: iso holds, LE abd/add, UE horiz abd/add  SLS with hip abd- speed for resistance from water    Eval SLS with level pelvis- prog to slow walking with hip abd activation     PATIENT EDUCATION:  Education details: exercise form/rationale   Person educated: Patient Education method: Programmer, multimedia, Demonstration, Tactile cues, Verbal cues, and Handouts Education comprehension: verbalized understanding, returned demonstration, verbal cues required, tactile cues required, and needs further education     HOME EXERCISE PROGRAM: [id number] SLS with level pelvis   ASSESSMENT:   CLINICAL IMPRESSION: Focused on engagement of core and proximal le strength for central stability to improve distal control. Instruction on water Pilates which pt reports feeling  good core activation.  Demos good body awareness gaining pilates positioning and executing most exercises well. Pt did have difficulty completing reverse plank. She will benefit from aquatic therapy going forward to improve core strength and stability improving distal control.     REHAB POTENTIAL: Good   CLINICAL DECISION MAKING: Evolving/moderate complexity   EVALUATION COMPLEXITY: Moderate     GOALS: Goals reviewed with patient? Yes   SHORT TERM GOALS:   STG Name Target Date Goal status  1 Resolution of trendelenburg gait pattern Baseline: mild at eval 10/11/2021 achieved                                                          LONG TERM GOALS:    LTG Name Target Date Goal status  1 Tolerate standing for market Baseline: 11/15/2021 INITIAL  2 Pt will report reduced use of shifted posture to RLE as compensation Baseline: 11/15/2021 INITIAL  3 Knee pain to <=3/10 during functional daily activities Baseline:ranges from 3-6 on average with  increases due to activity 11/15/2021 INITIAL  4 Pt will only require knee ice 1/day for pain control Baseline:multiple a day requried at eval 11/15/2021 INITIAL  5 Pt will be able to navigate stairs with max of mild discomfort Baseline: significant at eval 11/15/21 Initial    PT FREQUENCY: 1-2x/week   will adapt for changes in work schedule   PT DURATION: 8 weeks   PLANNED INTERVENTIONS: Therapeutic exercises, Therapeutic activity, Neuro Muscular re-education, Balance training, Gait training, Patient/Family education, Joint mobilization, Stair training,  Aquatic Therapy, Dry Needling, Cryotherapy, Moist heat, and Manual therapy   PLAN FOR NEXT SESSION: continue aquatics- pilates approaches with core focus, high level balance challenges- mindfulness during exercise    Patricia Mendoza MPT 10/22/21 2:06 PM

## 2021-10-24 ENCOUNTER — Ambulatory Visit (HOSPITAL_BASED_OUTPATIENT_CLINIC_OR_DEPARTMENT_OTHER): Payer: BC Managed Care – PPO | Admitting: Physical Therapy

## 2021-10-24 ENCOUNTER — Telehealth: Payer: Self-pay

## 2021-10-24 ENCOUNTER — Encounter (HOSPITAL_BASED_OUTPATIENT_CLINIC_OR_DEPARTMENT_OTHER): Payer: Self-pay | Admitting: Physical Therapy

## 2021-10-24 ENCOUNTER — Other Ambulatory Visit: Payer: Self-pay

## 2021-10-24 DIAGNOSIS — M25562 Pain in left knee: Secondary | ICD-10-CM | POA: Diagnosis not present

## 2021-10-24 DIAGNOSIS — R2681 Unsteadiness on feet: Secondary | ICD-10-CM | POA: Diagnosis not present

## 2021-10-24 DIAGNOSIS — R2689 Other abnormalities of gait and mobility: Secondary | ICD-10-CM

## 2021-10-24 DIAGNOSIS — R293 Abnormal posture: Secondary | ICD-10-CM | POA: Diagnosis not present

## 2021-10-24 DIAGNOSIS — M6281 Muscle weakness (generalized): Secondary | ICD-10-CM

## 2021-10-24 DIAGNOSIS — J069 Acute upper respiratory infection, unspecified: Secondary | ICD-10-CM | POA: Diagnosis not present

## 2021-10-24 DIAGNOSIS — R519 Headache, unspecified: Secondary | ICD-10-CM | POA: Diagnosis not present

## 2021-10-24 DIAGNOSIS — M25561 Pain in right knee: Secondary | ICD-10-CM | POA: Diagnosis not present

## 2021-10-24 DIAGNOSIS — G8929 Other chronic pain: Secondary | ICD-10-CM

## 2021-10-24 DIAGNOSIS — R058 Other specified cough: Secondary | ICD-10-CM | POA: Diagnosis not present

## 2021-10-24 DIAGNOSIS — R262 Difficulty in walking, not elsewhere classified: Secondary | ICD-10-CM | POA: Diagnosis not present

## 2021-10-24 NOTE — Therapy (Signed)
OUTPATIENT PHYSICAL THERAPY TREATMENT NOTE   Patient Name: Patricia Mendoza MRN: [id number] DOB:1956-08-24, 65 y.o., female Today's Date: 10/24/2021  PCP: Creola Corn, MD REFERRING PROVIDER: Salvatore Marvel, MD   PT End of Session - 10/24/21 0955     Visit Number 4    Number of Visits 17    Date for PT Re-Evaluation 11/15/21    Authorization Type BCBS    PT Start Time 0940    PT Stop Time 1030    PT Time Calculation (min) 50 min    Activity Tolerance Patient tolerated treatment well    Behavior During Therapy WFL for tasks assessed/performed             Past Medical History:  Diagnosis Date   Anxiety    Depression    Gastritis    Hiatal hernia    Hyperglycemia    Hyperlipidemia    Hypertension    Insomnia    OA (osteoarthritis)    Varicose veins of bilateral lower extremities with pain    Past Surgical History:  Procedure Laterality Date   DILATION AND CURETTAGE OF UTERUS     EAR SURGERY  1993   FOOT FRACTURE SURGERY  11/2002   hx of gum abscess, I&D     PATELLA FRACTURE SURGERY  11/17/2018   2 screws   There are no problems to display for this patient.   REFERRING DIAG: s/p Lt knee scope, OA  THERAPY DIAG:  Abnormal posture  Chronic pain of right knee  Difficulty in walking, not elsewhere classified  Muscle weakness (generalized)  Unsteadiness on feet  Other abnormalities of gait and mobility  PERTINENT HISTORY: Lt foot fx surgery in 2003, L4-5 arthritis and buldging  PRECAUTIONS: none  SUBJECTIVE: "had shot. Have appointment for MRI for knee and maybe back.  Have been feeling very tired by 1pm daily. Seems to be after having the flu.  Have MD appointment today to address"  PAIN:  Are you having pain? No VAS scale: 3/10 with stairs 11/15 Pain location: Lt knee  Aggravating factors: stairs Relieving factors: rest    OBJECTIVE:  PATIENT SURVEYS:  LEFS 43/80   COGNITION:          Overall cognitive status: Within functional  limits for tasks assessed                        SENSATION:          WFL   MUSCLE LENGTH: Good flexibility bilaterally   POSTURE:  Mild incr in kyphosis, good LE alignment   LE AROM/PROM:   WFL   LE MMT:   Gross hip strength 5/5 bilat Unable to demo pelvic alignment in SLS       GAIT:   Comments: mild Rt trendelenburg in stance phase       TODAY'S TREATMENT:  11/17  AquaticREHABdocumentation: Water will reduce edema around joints through hydrostatic pressure that allows for improved ROM, Water will allow for reduced gait deviation due to reduced joint loading through buoyancy to help patient improve posture without excess stress and pain. , Water will aid with movement using the current and laminar flow while the buoyancy reduces weight bearing, and Hydrostatic pressure also supports joints by unweighting joint load by at least 50 % in 3-4 feet depth water. 80% in chest to neck deep water.  Water walking warm up forward, back and side x 6 widths Seated stretching hamstrings, gastroc, glute and adductors  Exercises  and aerobic capacity challenge: Long leg hip flex slow 2 x 10, quick x 10 Long leg hip extension 2 x 10 slow; 10 quick Long leg flex then add x 10  Long leg abd then ext x 10  Step ups bottom step R/L forward x10.  Attempted restro with increased discomfort L knee Pilates: lat pull 2 x10 yellow noodle; plank x3 holding 30 seconds; swimming x5 Introduced side to side pendulums using 2.5 blue foam hand buoys. Multiple attempts to gain psosition and execute. Cycling suspended with hand buoys 20 fast revolutions then 20 slow x 5 trials.  11-15 AquaticREHABdocumentation: Water will reduce edema around joints through hydrostatic pressure that allows for improved ROM, Water will allow for reduced gait deviation due to reduced joint loading through buoyancy to help patient improve posture without excess stress and pain. , Water will aid with movement using the  current and laminar flow while the buoyancy reduces weight bearing, and Hydrostatic pressure also supports joints by unweighting joint load by at least 50 % in 3-4 feet depth water. 80% in chest to neck deep water.  Water walking warm up forward, back and side x 6 widths Seated stretching hamstrings, gastroc, glute and adductors Long leg hip flex slow 2 x 10, quick x 10 Long leg hip extension 2 x 10 slow Long leg add x 10 slow Step ups submerged to chest x10 R/L forward and side stepping Pilates: lat pull 2 x10; swimming x10;plank x3 holding for up to 30 seconds; reverse plank multiple attempts to gain position  11/3 AquaticREHABdocumentation: Water will reduce edema around joints through hydrostatic pressure that allows for improved ROM, Water will allow for reduced gait deviation due to reduced joint loading through buoyancy to help patient improve posture without excess stress and pain. , Water will aid with movement using the current and laminar flow while the buoyancy reduces weight bearing, and Hydrostatic pressure also supports joints by unweighting joint load by at least 50 % in 3-4 feet depth water. 80% in chest to neck deep water.   Walking for warmup  Wall sit at side of pool: noodle behind knee press downs, noodle behind ankle: horiz abd/add HS stretch, long axis circles  Facing wall: quad stretch & knee flexion stretch with noodle under dorsum of foot, HSS foot on wall  Plank series, cuffs on ankle, belt under waist: alt hip flexion, alt knee pull  Floating V series: iso holds, LE abd/add, UE horiz abd/add  SLS with hip abd- speed for resistance from water    Eval SLS with level pelvis- prog to slow walking with hip abd activation     PATIENT EDUCATION:  Education details: exercise form/rationale   Person educated: Patient Education method: Explanation, Demonstration, Tactile cues, Verbal cues, and Handouts Education comprehension: verbalized understanding, returned  demonstration, verbal cues required, tactile cues required, and needs further education     HOME EXERCISE PROGRAM: [id number] SLS with level pelvis Added aquatics 11/17   ASSESSMENT:   CLINICAL IMPRESSION: Progressed focus on central control adding to Pilates exercises.  Also added aerobic capacity challenges to improve endurance.  Pt executes all prescribed challenges well. Some core fatigue prior to adding lateral core strengthening increasing difficulty executing side to side pendulum. Pt demonstrates good focus and able to target proper muscles for activation. Left knee without pain, some discomfort.     REHAB POTENTIAL: Good   CLINICAL DECISION MAKING: Evolving/moderate complexity   EVALUATION COMPLEXITY: Moderate     GOALS: Goals reviewed with  patient? Yes   SHORT TERM GOALS:   STG Name Target Date Goal status  1 Resolution of trendelenburg gait pattern Baseline: mild at eval 10/11/2021 achieved                                                          LONG TERM GOALS:    LTG Name Target Date Goal status  1 Tolerate standing for market Baseline: 11/15/2021 INITIAL  2 Pt will report reduced use of shifted posture to RLE as compensation Baseline: 11/15/2021 INITIAL  3 Knee pain to <=3/10 during functional daily activities Baseline:ranges from 3-6 on average with increases due to activity 11/15/2021 INITIAL  4 Pt will only require knee ice 1/day for pain control Baseline:multiple a day requried at eval 11/15/2021 INITIAL  5 Pt will be able to navigate stairs with max of mild discomfort Baseline: significant at eval 11/15/21 Initial    PT FREQUENCY: 1-2x/week   will adapt for changes in work schedule   PT DURATION: 8 weeks   PLANNED INTERVENTIONS: Therapeutic exercises, Therapeutic activity, Neuro Muscular re-education, Balance training, Gait training, Patient/Family education, Joint mobilization, Stair training, Aquatic Therapy, Dry Needling, Cryotherapy, Moist heat,  and Manual therapy   PLAN FOR NEXT SESSION: continue aquatics- pilates approaches with core focus, high level balance challenges- mindfulness during exercise    Corrie Dandy Tomma Lightning) Jasamine Pottinger MPT 10/24/21 10:44 AM

## 2021-10-24 NOTE — Telephone Encounter (Signed)
I called patient to discuss her sleep study results. No answer, left a message asking her to call us back.

## 2021-10-24 NOTE — Telephone Encounter (Signed)
-----   Message from Huston Foley, MD sent at 10/16/2021  5:36 PM EST ----- Patient referred by Dr. Timothy Lasso, seen by me on 09/16/2021, patient had a HST on 10/14/2021.    Please call and notify the patient that the recent home sleep test showed obstructive sleep apnea in the severe range. I recommend treatment for this in the form of autoPAP, which means, that we don't have to bring her in for a sleep study with CPAP, but will let her start using a so called autoPAP machine at home, through a DME company (of her choice, or as per insurance requirement). The DME representative will fit the patient with a mask of choice, educate her on how to use the machine, how to put the mask on, etc. I have placed an order in the chart. Please send the order to a local DME, talk to patient, send report to referring MD. Please also reinforce the need for compliance with treatment. We will need a FU in sleep clinic for 10 weeks post-PAP set up, please arrange that with me or one of our NPs. Thanks,   Huston Foley, MD, PhD [facility])

## 2021-10-25 DIAGNOSIS — M25562 Pain in left knee: Secondary | ICD-10-CM | POA: Diagnosis not present

## 2021-10-29 ENCOUNTER — Encounter: Payer: Self-pay | Admitting: *Deleted

## 2021-10-29 ENCOUNTER — Ambulatory Visit (HOSPITAL_BASED_OUTPATIENT_CLINIC_OR_DEPARTMENT_OTHER): Payer: BC Managed Care – PPO | Admitting: Physical Therapy

## 2021-10-29 ENCOUNTER — Other Ambulatory Visit: Payer: Self-pay

## 2021-10-29 ENCOUNTER — Encounter (HOSPITAL_BASED_OUTPATIENT_CLINIC_OR_DEPARTMENT_OTHER): Payer: Self-pay | Admitting: Physical Therapy

## 2021-10-29 DIAGNOSIS — R262 Difficulty in walking, not elsewhere classified: Secondary | ICD-10-CM | POA: Diagnosis not present

## 2021-10-29 DIAGNOSIS — M1712 Unilateral primary osteoarthritis, left knee: Secondary | ICD-10-CM | POA: Diagnosis not present

## 2021-10-29 DIAGNOSIS — M6281 Muscle weakness (generalized): Secondary | ICD-10-CM

## 2021-10-29 DIAGNOSIS — M25561 Pain in right knee: Secondary | ICD-10-CM

## 2021-10-29 DIAGNOSIS — R2689 Other abnormalities of gait and mobility: Secondary | ICD-10-CM

## 2021-10-29 DIAGNOSIS — R293 Abnormal posture: Secondary | ICD-10-CM

## 2021-10-29 DIAGNOSIS — G8929 Other chronic pain: Secondary | ICD-10-CM | POA: Diagnosis not present

## 2021-10-29 DIAGNOSIS — R2681 Unsteadiness on feet: Secondary | ICD-10-CM | POA: Diagnosis not present

## 2021-10-29 DIAGNOSIS — M25562 Pain in left knee: Secondary | ICD-10-CM | POA: Diagnosis not present

## 2021-10-29 NOTE — Telephone Encounter (Signed)
-----   Message from Huston Foley, MD sent at 10/16/2021  5:36 PM EST ----- Patient referred by Dr. Timothy Lasso, seen by me on 09/16/2021, patient had a HST on 10/14/2021.    Please call and notify the patient that the recent home sleep test showed obstructive sleep apnea in the severe range. I recommend treatment for this in the form of autoPAP, which means, that we don't have to bring her in for a sleep study with CPAP, but will let her start using a so called autoPAP machine at home, through a DME company (of her choice, or as per insurance requirement). The DME representative will fit the patient with a mask of choice, educate her on how to use the machine, how to put the mask on, etc. I have placed an order in the chart. Please send the order to a local DME, talk to patient, send report to referring MD. Please also reinforce the need for compliance with treatment. We will need a FU in sleep clinic for 10 weeks post-PAP set up, please arrange that with me or one of our NPs. Thanks,   Huston Foley, MD, PhD [facility])

## 2021-10-29 NOTE — Telephone Encounter (Signed)
I called pt. I advised pt that Dr. Frances Furbish reviewed their sleep study results and found that pt severe OSA. Dr. Frances Furbish recommends that pt autopap. I reviewed PAP compliance expectations with the pt. Pt is agreeable to starting an auto-PAP. I advised pt that an order will be sent to a DME, [facility], and they will call the pt within about one week after they file with the pt's insurance. [facility] will show the pt how to use the machine, fit for masks, and troubleshoot the auto-PAP if needed. A follow up appt was made for insurance purposes with Dr. Frances Furbish on 01-28-22 at 0845. Pt verbalized understanding to arrive 15 minutes early and bring their auto-PAP. A letter with all of this information in it will be mailed to the pt as a reminder. I verified with the pt that the address we have on file is correct. Pt verbalized understanding of results. Pt had no questions at this time but was encouraged to call back if questions arise. I have sent the order to [facility] and have received confirmation that they have received the order.

## 2021-10-29 NOTE — Therapy (Signed)
OUTPATIENT PHYSICAL THERAPY TREATMENT NOTE   Patient Name: Patricia Mendoza MRN: [id number] DOB:Mar 24, 1956, 65 y.o., female Today's Date: 10/29/2021  PCP: Creola Corn, MD REFERRING PROVIDER: Salvatore Marvel, MD   PT End of Session - 10/29/21 0915     Visit Number 5    Number of Visits 17    Date for PT Re-Evaluation 11/15/21    Authorization Type BCBS    PT Start Time 0900    PT Stop Time 0945    PT Time Calculation (min) 45 min    Activity Tolerance Patient tolerated treatment well    Behavior During Therapy WFL for tasks assessed/performed             Past Medical History:  Diagnosis Date   Anxiety    Depression    Gastritis    Hiatal hernia    Hyperglycemia    Hyperlipidemia    Hypertension    Insomnia    OA (osteoarthritis)    Varicose veins of bilateral lower extremities with pain    Past Surgical History:  Procedure Laterality Date   DILATION AND CURETTAGE OF UTERUS     EAR SURGERY  1993   FOOT FRACTURE SURGERY  11/2002   hx of gum abscess, I&D     PATELLA FRACTURE SURGERY  11/17/2018   2 screws   There are no problems to display for this patient.   REFERRING DIAG: s/p Lt knee scope, OA  THERAPY DIAG:  Abnormal posture  Chronic pain of right knee  Difficulty in walking, not elsewhere classified  Muscle weakness (generalized)  Other abnormalities of gait and mobility  Unsteadiness on feet  PERTINENT HISTORY: Lt foot fx surgery in 2003, L4-5 arthritis and buldging  PRECAUTIONS: none  SUBJECTIVE: "Patricia Mendoza called with MRI results said OA will not get better. Will continue with gel shots then address issue in future if pain is unbearable"  PAIN:  Are you having pain? yes VAS scale: 4/10 with l Knee flex at 90 deg.  0/10 resting or <90 Degrees of knee flex  11/21 Pain location: Lt knee  Aggravating factors: stairs Relieving factors: rest    OBJECTIVE:  PATIENT SURVEYS:  LEFS 43/80   COGNITION:          Overall cognitive  status: Within functional limits for tasks assessed                        SENSATION:          WFL   MUSCLE LENGTH: Good flexibility bilaterally   POSTURE:  Mild incr in kyphosis, good LE alignment   LE AROM/PROM:   WFL   LE MMT:   Gross hip strength 5/5 bilat Unable to demo pelvic alignment in SLS       GAIT:   Comments: mild Rt trendelenburg in stance phase       TODAY'S TREATMENT:  11/22 AquaticREHABdocumentation: Water will reduce edema around joints through hydrostatic pressure that allows for improved ROM, Water will allow for reduced gait deviation due to reduced joint loading through buoyancy to help patient improve posture without excess stress and pain. , Water will aid with movement using the current and laminar flow while the buoyancy reduces weight bearing, and Hydrostatic pressure also supports joints by unweighting joint load by at least 50 % in 3-4 feet depth water. 80% in chest to neck deep water.  Water walking warm up forward, back and side x 6 widths Seated stretching hamstrings,  gastroc, glute and adductors Flutter kicking at knee 3 x 1 min  Exercises and aerobic capacity challenge: Long leg hip flex slow 2 x 10, quick x 10 Long leg hip extension 2 x 10 slow; 10 quick Long leg add/abd x10   Pilates: lat pull 2 x10 yellow noodle; plank x 30 sec; swimming 2 x5 side to side pendulums using 2.5 blue foam hand buoys. Continued need for verbal and demonstration to gain position. Improved execution 2x10 Reverse plank x 5       11/17  AquaticREHABdocumentation: Water will reduce edema around joints through hydrostatic pressure that allows for improved ROM, Water will allow for reduced gait deviation due to reduced joint loading through buoyancy to help patient improve posture without excess stress and pain. , Water will aid with movement using the current and laminar flow while the buoyancy reduces weight bearing, and Hydrostatic pressure also supports  joints by unweighting joint load by at least 50 % in 3-4 feet depth water. 80% in chest to neck deep water.  Water walking warm up forward, back and side x 6 widths Seated stretching hamstrings, gastroc, glute and adductors  Exercises and aerobic capacity challenge: Long leg hip flex slow 2 x 10, quick x 10 Long leg hip extension 2 x 10 slow; 10 quick Long leg flex then add x 10  Long leg abd then ext x 10  Step ups bottom step R/L forward x10.  Attempted restro with increased discomfort L knee Pilates: lat pull 2 x10 yellow noodle; plank x3 holding 30 seconds; swimming x5 Introduced side to side pendulums using 2.5 blue foam hand buoys. Multiple attempts to gain psosition and execute. Cycling suspended with hand buoys 20 fast revolutions then 20 slow x 5 trials.  11-15 AquaticREHABdocumentation: Water will reduce edema around joints through hydrostatic pressure that allows for improved ROM, Water will allow for reduced gait deviation due to reduced joint loading through buoyancy to help patient improve posture without excess stress and pain. , Water will aid with movement using the current and laminar flow while the buoyancy reduces weight bearing, and Hydrostatic pressure also supports joints by unweighting joint load by at least 50 % in 3-4 feet depth water. 80% in chest to neck deep water.  Water walking warm up forward, back and side x 6 widths Seated stretching hamstrings, gastroc, glute and adductors Long leg hip flex slow 2 x 10, quick x 10 Long leg hip extension 2 x 10 slow Long leg add x 10 slow Step ups submerged to chest x10 R/L forward and side stepping Pilates: lat pull 2 x10; swimming x10;plank x3 holding for up to 30 seconds; reverse plank multiple attempts to gain position  11/3 AquaticREHABdocumentation: Water will reduce edema around joints through hydrostatic pressure that allows for improved ROM, Water will allow for reduced gait deviation due to reduced joint loading  through buoyancy to help patient improve posture without excess stress and pain. , Water will aid with movement using the current and laminar flow while the buoyancy reduces weight bearing, and Hydrostatic pressure also supports joints by unweighting joint load by at least 50 % in 3-4 feet depth water. 80% in chest to neck deep water.   Walking for warmup  Wall sit at side of pool: noodle behind knee press downs, noodle behind ankle: horiz abd/add HS stretch, long axis circles  Facing wall: quad stretch & knee flexion stretch with noodle under dorsum of foot, HSS foot on wall  Plank series, cuffs  on ankle, belt under waist: alt hip flexion, alt knee pull  Floating V series: iso holds, LE abd/add, UE horiz abd/add  SLS with hip abd- speed for resistance from water    Eval SLS with level pelvis- prog to slow walking with hip abd activation     PATIENT EDUCATION:  Education details: exercise form/rationale   Person educated: Patient Education method: Explanation, Demonstration, Tactile cues, Verbal cues, and Handouts Education comprehension: verbalized understanding, returned demonstration, verbal cues required, tactile cues required, and needs further education     HOME EXERCISE PROGRAM: [id number] SLS with level pelvis Added aquatics 11/17   ASSESSMENT:   CLINICAL IMPRESSION: Pt scheduled for last gel shot. She is not certain of any significant improvements as of yet from shots. She seems to understand disease process with results from MRI.  She is committed to continuing with therapy on land and in water to improve her and control her symptoms as long as able to avoid a surgical procedure.  She is progressing in core control as demonstrated through water Pilate techniques/execution and proximal strengthening improving distal control.  Will add glute strengthening.        REHAB POTENTIAL: Good   CLINICAL DECISION MAKING: Evolving/moderate complexity   EVALUATION COMPLEXITY:  Moderate     GOALS: Goals reviewed with patient? Yes   SHORT TERM GOALS:   STG Name Target Date Goal status  1 Resolution of trendelenburg gait pattern Baseline: mild at eval 10/11/2021 achieved                                                          LONG TERM GOALS:    LTG Name Target Date Goal status  1 Tolerate standing for market Baseline: 11/15/2021 INITIAL  2 Pt will report reduced use of shifted posture to RLE as compensation Baseline: 11/15/2021 INITIAL  3 Knee pain to <=3/10 during functional daily activities Baseline:ranges from 3-6 on average with increases due to activity 11/15/2021 INITIAL  4 Pt will only require knee ice 1/day for pain control Baseline:multiple a day requried at eval 11/15/2021 INITIAL  5 Pt will be able to navigate stairs with max of mild discomfort Baseline: significant at eval 11/15/21 Initial    PT FREQUENCY: 1-2x/week   will adapt for changes in work schedule   PT DURATION: 8 weeks   PLANNED INTERVENTIONS: Therapeutic exercises, Therapeutic activity, Neuro Muscular re-education, Balance training, Gait training, Patient/Family education, Joint mobilization, Stair training, Aquatic Therapy, Dry Needling, Cryotherapy, Moist heat, and Manual therapy   PLAN FOR NEXT SESSION: SLS with level pelvis, glut strengtheing    Corrie Dandy Tomma Lightning) Nolan Lasser MPT 10/29/21 6:52 PM

## 2021-11-05 ENCOUNTER — Ambulatory Visit (HOSPITAL_BASED_OUTPATIENT_CLINIC_OR_DEPARTMENT_OTHER): Payer: BC Managed Care – PPO | Admitting: Physical Therapy

## 2021-11-05 ENCOUNTER — Encounter (HOSPITAL_BASED_OUTPATIENT_CLINIC_OR_DEPARTMENT_OTHER): Payer: Self-pay | Admitting: Physical Therapy

## 2021-11-05 ENCOUNTER — Other Ambulatory Visit: Payer: Self-pay

## 2021-11-05 DIAGNOSIS — R2681 Unsteadiness on feet: Secondary | ICD-10-CM | POA: Diagnosis not present

## 2021-11-05 DIAGNOSIS — G8929 Other chronic pain: Secondary | ICD-10-CM | POA: Diagnosis not present

## 2021-11-05 DIAGNOSIS — R293 Abnormal posture: Secondary | ICD-10-CM | POA: Insufficient documentation

## 2021-11-05 DIAGNOSIS — R2689 Other abnormalities of gait and mobility: Secondary | ICD-10-CM | POA: Diagnosis not present

## 2021-11-05 DIAGNOSIS — M25562 Pain in left knee: Secondary | ICD-10-CM | POA: Diagnosis not present

## 2021-11-05 DIAGNOSIS — M6281 Muscle weakness (generalized): Secondary | ICD-10-CM | POA: Diagnosis not present

## 2021-11-05 DIAGNOSIS — M25561 Pain in right knee: Secondary | ICD-10-CM | POA: Diagnosis not present

## 2021-11-05 DIAGNOSIS — R262 Difficulty in walking, not elsewhere classified: Secondary | ICD-10-CM | POA: Diagnosis not present

## 2021-11-05 NOTE — Telephone Encounter (Signed)
Pt called states she will be switching over to Medicare this Friday not sure if this will affect the process. She will have the medicare and the BCBS.

## 2021-11-05 NOTE — Telephone Encounter (Signed)
Spoke with the patient and let her know that she would need to call [facility] to discuss the change in her insurance.  She verbalized understanding and I gave her the phone number to call.  Her questions were answered.

## 2021-11-05 NOTE — Therapy (Signed)
OUTPATIENT PHYSICAL THERAPY TREATMENT NOTE   Patient Name: Patricia Mendoza MRN: [id number] DOB:[birth year], 65 y.o., female Today's Date: 11/05/2021  PCP: Creola Corn, MD REFERRING PROVIDER: Salvatore Marvel, MD   PT End of Session - 11/05/21 0929     Visit Number 6    Number of Visits 17    Date for PT Re-Evaluation 11/15/21    Authorization Type BCBS    PT Start Time 0905    PT Stop Time 0950    PT Time Calculation (min) 45 min    Equipment Utilized During Treatment Other (comment)   Hand buoys, noodles   Activity Tolerance Patient tolerated treatment well    Behavior During Therapy WFL for tasks assessed/performed             Past Medical History:  Diagnosis Date   Anxiety    Depression    Gastritis    Hiatal hernia    Hyperglycemia    Hyperlipidemia    Hypertension    Insomnia    OA (osteoarthritis)    Varicose veins of bilateral lower extremities with pain    Past Surgical History:  Procedure Laterality Date   DILATION AND CURETTAGE OF UTERUS     EAR SURGERY  1993   FOOT FRACTURE SURGERY  11/2002   hx of gum abscess, I&D     PATELLA FRACTURE SURGERY  11/17/2018   2 screws   Patient Active Problem List   Diagnosis Date Noted   Abnormal posture 11/05/2021     REFERRING DIAG: s/p Lt knee scope, OA  THERAPY DIAG:  Chronic pain of left knee  PERTINENT HISTORY: Lt foot fx surgery in 2003, L4-5 arthritis and buldging  PRECAUTIONS: none  SUBJECTIVE: "Not sleeping well.  Sleep struds says I woke myself up 43 times in an hour"  PAIN:  Are you having pain? yes VAS scale: 2/10 with l Knee flex at 90 deg.  0/10 resting or <90 Degrees of knee flex  11/29 Pain location: Lt knee  Aggravating factors: stairs Relieving factors: rest    OBJECTIVE:  PATIENT SURVEYS:  LEFS 43/80   COGNITION:          Overall cognitive status: Within functional limits for tasks assessed                        SENSATION:          WFL   MUSCLE LENGTH: Good  flexibility bilaterally   POSTURE:  Mild incr in kyphosis, good LE alignment   LE AROM/PROM:   WFL   LE MMT:   Gross hip strength 5/5 bilat Unable to demo pelvic alignment in SLS       GAIT:   Comments: mild Rt trendelenburg in stance phase       TODAY'S TREATMENT:  11/29 AquaticREHABdocumentation: Water will reduce edema around joints through hydrostatic pressure that allows for improved ROM, Water will allow for reduced gait deviation due to reduced joint loading through buoyancy to help patient improve posture without excess stress and pain. , Water will aid with movement using the current and laminar flow while the buoyancy reduces weight bearing, and Hydrostatic pressure also supports joints by unweighting joint load by at least 50 % in 3-4 feet depth water. 80% in chest to neck deep water.  Water walking warm up forward, back and side x 6 widths Seated stretching hamstrings, gastroc, glute and adductors   Exercises and aerobic capacity challenge: Long leg hip flex  slow 2 x 10, quick x 10 Long leg hip extension 2 x 10 slow; 10 quick Long leg add/abd x10   Pilates:  Reverse plank x 5   side to side pendulums using 2.5 blue foam hand buoys. 2x10 excellent execution Sup to prone using yellow buoys 2 x 10. Cues for abdominal focus and execution/positioning.  Balance challenges -sitting on yellow noodle multiple trials.  Standing Step ups forward, and side stepping x 10 Retro with discomfort Squats x 10 on bottom water step.  Water walking between all exercises for stretching and recovery.  11/22 AquaticREHABdocumentation: Water will reduce edema around joints through hydrostatic pressure that allows for improved ROM, Water will allow for reduced gait deviation due to reduced joint loading through buoyancy to help patient improve posture without excess stress and pain. , Water will aid with movement using the current and laminar flow while the buoyancy reduces  weight bearing, and Hydrostatic pressure also supports joints by unweighting joint load by at least 50 % in 3-4 feet depth water. 80% in chest to neck deep water.  Water walking warm up forward, back and side x 6 widths Seated stretching hamstrings, gastroc, glute and adductors   Exercises and aerobic capacity challenge: Long leg hip flex slow 2 x 10, quick x 10 Long leg hip extension 2 x 10 slow; 10 quick Long leg add/abd x10   Pilates:  Reverse plank x 5   side to side pendulums using 2.5 blue foam hand buoys. 2x10 excellent execution Sup to prone using yellow buoys 2 x 10. Cues for abdominal focus and execution/positioning.  Balance challenges -sitting on yellow noodle multiple trials. -sitting balance in same position as above but with noodle suspended under buttocks. (Precursor for reverse plank).  Multiple trials.  Water walking between all exercises for stretching and recovery.          11/17  AquaticREHABdocumentation: Water will reduce edema around joints through hydrostatic pressure that allows for improved ROM, Water will allow for reduced gait deviation due to reduced joint loading through buoyancy to help patient improve posture without excess stress and pain. , Water will aid with movement using the current and laminar flow while the buoyancy reduces weight bearing, and Hydrostatic pressure also supports joints by unweighting joint load by at least 50 % in 3-4 feet depth water. 80% in chest to neck deep water.  Water walking warm up forward, back and side x 6 widths Seated stretching hamstrings, gastroc, glute and adductors  Exercises and aerobic capacity challenge: Long leg hip flex slow 2 x 10, quick x 10 Long leg hip extension 2 x 10 slow; 10 quick Long leg flex then add x 10  Long leg abd then ext x 10  Step ups bottom step R/L forward x10.  Attempted restro with increased discomfort L knee Pilates: lat pull 2 x10 yellow noodle; plank x3 holding 30 seconds;  swimming x5 Introduced side to side pendulums using 2.5 blue foam hand buoys. Multiple attempts to gain psosition and execute. Cycling suspended with hand buoys 20 fast revolutions then 20 slow x 5 trials.  11-15 AquaticREHABdocumentation: Water will reduce edema around joints through hydrostatic pressure that allows for improved ROM, Water will allow for reduced gait deviation due to reduced joint loading through buoyancy to help patient improve posture without excess stress and pain. , Water will aid with movement using the current and laminar flow while the buoyancy reduces weight bearing, and Hydrostatic pressure also supports joints by unweighting joint  load by at least 50 % in 3-4 feet depth water. 80% in chest to neck deep water.  Water walking warm up forward, back and side x 6 widths Seated stretching hamstrings, gastroc, glute and adductors Long leg hip flex slow 2 x 10, quick x 10 Long leg hip extension 2 x 10 slow Long leg add x 10 slow Step ups submerged to chest x10 R/L forward and side stepping Pilates: lat pull 2 x10; swimming x10;plank x3 holding for up to 30 seconds; reverse plank multiple attempts to gain position  11/3 AquaticREHABdocumentation: Water will reduce edema around joints through hydrostatic pressure that allows for improved ROM, Water will allow for reduced gait deviation due to reduced joint loading through buoyancy to help patient improve posture without excess stress and pain. , Water will aid with movement using the current and laminar flow while the buoyancy reduces weight bearing, and Hydrostatic pressure also supports joints by unweighting joint load by at least 50 % in 3-4 feet depth water. 80% in chest to neck deep water.   Walking for warmup  Wall sit at side of pool: noodle behind knee press downs, noodle behind ankle: horiz abd/add HS stretch, long axis circles  Facing wall: quad stretch & knee flexion stretch with noodle under dorsum of foot, HSS foot  on wall  Plank series, cuffs on ankle, belt under waist: alt hip flexion, alt knee pull  Floating V series: iso holds, LE abd/add, UE horiz abd/add  SLS with hip abd- speed for resistance from water    Eval SLS with level pelvis- prog to slow walking with hip abd activation     PATIENT EDUCATION:  Education details: exercise form/rationale   Person educated: Patient Education method: Explanation, Demonstration, Tactile cues, Verbal cues, and Handouts Education comprehension: verbalized understanding, returned demonstration, verbal cues required, tactile cues required, and needs further education     HOME EXERCISE PROGRAM: [id number] SLS with level pelvis Added aquatics 11/17   ASSESSMENT:   CLINICAL IMPRESSION: Pt not feeling well.  Has been dx with sleep apnea.  Decreased left knee pain but it does continue after last gel shot. She is considering a Cortizone shot if needed.  Pt improving with core strength completing side to side pendulum with increased reps and improved execution as well as able to gain reverse planks positioning although not able to hold controlled.  No complaints of left knee discomfort with any activity other than backward step up which I limited reps.       REHAB POTENTIAL: Good   CLINICAL DECISION MAKING: Evolving/moderate complexity   EVALUATION COMPLEXITY: Moderate     GOALS: Goals reviewed with patient? Yes   SHORT TERM GOALS:   STG Name Target Date Goal status  1 Resolution of trendelenburg gait pattern Baseline: mild at eval 10/11/2021 achieved                                                          LONG TERM GOALS:    LTG Name Target Date Goal status  1 Tolerate standing for market Baseline: 11/15/2021 INITIAL  2 Pt will report reduced use of shifted posture to RLE as compensation Baseline: 11/15/2021 INITIAL  3 Knee pain to <=3/10 during functional daily activities Baseline:ranges from 3-6 on average with increases due to  activity 11/15/2021  INITIAL  4 Pt will only require knee ice 1/day for pain control Baseline:multiple a day requried at eval 11/15/2021 INITIAL  5 Pt will be able to navigate stairs with max of mild discomfort Baseline: significant at eval 11/15/21 Initial    PT FREQUENCY: 1-2x/week   will adapt for changes in work schedule   PT DURATION: 8 weeks   PLANNED INTERVENTIONS: Therapeutic exercises, Therapeutic activity, Neuro Muscular re-education, Balance training, Gait training, Patient/Family education, Joint mobilization, Stair training, Aquatic Therapy, Dry Needling, Cryotherapy, Moist heat, and Manual therapy   PLAN FOR NEXT SESSION: SLS with level pelvis, glut strengtheing    Corrie Dandy Tomma Lightning) Kathleen Likins MPT 11/05/21 1:47 PM

## 2021-11-07 ENCOUNTER — Telehealth: Payer: Self-pay | Admitting: *Deleted

## 2021-11-07 ENCOUNTER — Ambulatory Visit (HOSPITAL_BASED_OUTPATIENT_CLINIC_OR_DEPARTMENT_OTHER): Payer: Medicare Other | Attending: Orthopedic Surgery | Admitting: Physical Therapy

## 2021-11-07 ENCOUNTER — Other Ambulatory Visit: Payer: Self-pay

## 2021-11-07 ENCOUNTER — Encounter (HOSPITAL_BASED_OUTPATIENT_CLINIC_OR_DEPARTMENT_OTHER): Payer: Self-pay | Admitting: Physical Therapy

## 2021-11-07 DIAGNOSIS — G8929 Other chronic pain: Secondary | ICD-10-CM | POA: Insufficient documentation

## 2021-11-07 DIAGNOSIS — M25562 Pain in left knee: Secondary | ICD-10-CM | POA: Insufficient documentation

## 2021-11-07 NOTE — Telephone Encounter (Signed)
Per Wyona Almas, patient now has Medicare. She will need a new office visit and a new prescription. I called the pt and LVM asking for call back.   When she calls back, please let her know the message above and schedule her for next available appt with Aundra Millet NP, Amy NP, or Dr Frances Furbish. I see currently we have an opening with Aundra Millet NP for December 15th at 9:00 AM.

## 2021-11-07 NOTE — Therapy (Signed)
OUTPATIENT PHYSICAL THERAPY TREATMENT NOTE   Patient Name: Patricia Mendoza MRN: [id number] DOB:01-06-1956, 65 y.o., female Today's Date: 11/07/2021  PCP: Creola Corn, MD REFERRING PROVIDER: Salvatore Marvel, MD   PT End of Session - 11/07/21 [phone]     Visit Number 7    Number of Visits 17    Date for PT Re-Evaluation 11/15/21    Authorization Type BCBS    PT Start Time 0905    PT Stop Time 0950    PT Time Calculation (min) 45 min    Equipment Utilized During Treatment Other (comment)    Activity Tolerance Patient tolerated treatment well    Behavior During Therapy WFL for tasks assessed/performed             Past Medical History:  Diagnosis Date   Anxiety    Depression    Gastritis    Hiatal hernia    Hyperglycemia    Hyperlipidemia    Hypertension    Insomnia    OA (osteoarthritis)    Varicose veins of bilateral lower extremities with pain    Past Surgical History:  Procedure Laterality Date   DILATION AND CURETTAGE OF UTERUS     EAR SURGERY  1993   FOOT FRACTURE SURGERY  11/2002   hx of gum abscess, I&D     PATELLA FRACTURE SURGERY  11/17/2018   2 screws   Patient Active Problem List   Diagnosis Date Noted   Abnormal posture 11/05/2021     REFERRING DIAG: s/p Lt knee scope, OA  THERAPY DIAG:  Chronic pain of left knee  PERTINENT HISTORY: Lt foot fx surgery in 2003, L4-5 arthritis and buldging  PRECAUTIONS: none  SUBJECTIVE: "Doing well with my knee.  Feels funny at night when I am in bed.  Will probably get Cortizone shot"  PAIN:  Are you having pain? yes VAS scale: 1/10 with l Knee flex at 90 deg.  0/10 resting or <90 Degrees of knee flex  11/29 Pain location: Lt knee  Aggravating factors: stairs Relieving factors: rest    OBJECTIVE:  PATIENT SURVEYS:  LEFS 43/80   COGNITION:          Overall cognitive status: Within functional limits for tasks assessed                        SENSATION:          WFL   MUSCLE LENGTH: Good  flexibility bilaterally   POSTURE:  Mild incr in kyphosis, good LE alignment   LE AROM/PROM:   WFL   LE MMT:   Gross hip strength 5/5 bilat Unable to demo pelvic alignment in SLS       GAIT:   Comments: mild Rt trendelenburg in stance phase       TODAY'S TREATMENT:  12/1 AquaticREHABdocumentation: Water will reduce edema around joints through hydrostatic pressure that allows for improved ROM, Water will allow for reduced gait deviation due to reduced joint loading through buoyancy to help patient improve posture without excess stress and pain. , Water will aid with movement using the current and laminar flow while the buoyancy reduces weight bearing, and Hydrostatic pressure also supports joints by unweighting joint load by at least 50 % in 3-4 feet depth water. 80% in chest to neck deep water.  Water walking warm up forward, back and side x 6 widths Seated stretching hamstrings, gastroc, glute and adductors   Exercises and aerobic capacity challenge: Long leg hip  flex slow 2 x 12, quick x 15 Long leg hip extension 2 x 12 slow; 20 quick Long leg add/abd x12 Long leg hip extension then add x 12   Pilates:  Reverse plank x 5   side to side pendulums using yellow buoys 2 x 10. Cues for abdominal  and QL focus  and execution/positioning. Increased speed with last trial for increased resistance.  Balance challenges -sitting on yellow noodle with hands raised x3 holding for up to 30 seconds. Improved execution and balance abilty  Standing Resisted hip extension and abd/add using water bands  Water walking between all exercises  backward for stretching, recovery and glute engagement .   11/29 AquaticREHABdocumentation: Water will reduce edema around joints through hydrostatic pressure that allows for improved ROM, Water will allow for reduced gait deviation due to reduced joint loading through buoyancy to help patient improve posture without excess stress and pain. ,  Water will aid with movement using the current and laminar flow while the buoyancy reduces weight bearing, and Hydrostatic pressure also supports joints by unweighting joint load by at least 50 % in 3-4 feet depth water. 80% in chest to neck deep water.  Water walking warm up forward, back and side x 6 widths Seated stretching hamstrings, gastroc, glute and adductors   Exercises and aerobic capacity challenge: Long leg hip flex slow 2 x 10, quick x 10 Long leg hip extension 2 x 10 slow; 10 quick Long leg add/abd x10   Pilates:  Reverse plank x 5   side to side pendulums using 2.5 blue foam hand buoys. 2x10 excellent execution Sup to prone using yellow buoys 2 x 10. Cues for abdominal focus and execution/positioning.  Balance challenges -sitting on yellow noodle multiple trials.  Standing Step ups forward, and side stepping x 10 Retro with discomfort Squats x 10 on bottom water step.  Water walking between all exercises for stretching and recovery.  11/22 AquaticREHABdocumentation: Water will reduce edema around joints through hydrostatic pressure that allows for improved ROM, Water will allow for reduced gait deviation due to reduced joint loading through buoyancy to help patient improve posture without excess stress and pain. , Water will aid with movement using the current and laminar flow while the buoyancy reduces weight bearing, and Hydrostatic pressure also supports joints by unweighting joint load by at least 50 % in 3-4 feet depth water. 80% in chest to neck deep water.  Water walking warm up forward, back and side x 6 widths Seated stretching hamstrings, gastroc, glute and adductors   Exercises and aerobic capacity challenge: Long leg hip flex slow 2 x 10, quick x 10 Long leg hip extension 2 x 10 slow; 10 quick Long leg add/abd x10   Pilates:  Reverse plank x 5   side to side pendulums using 2.5 blue foam hand buoys. 2x10 excellent execution Sup to prone using  yellow buoys 2 x 10. Cues for abdominal focus and execution/positioning.  Balance challenges -sitting on yellow noodle multiple trials. -sitting balance in same position as above but with noodle suspended under buttocks. (Precursor for reverse plank).  Multiple trials.  Water walking between all exercises for stretching and recovery.          11/17  AquaticREHABdocumentation: Water will reduce edema around joints through hydrostatic pressure that allows for improved ROM, Water will allow for reduced gait deviation due to reduced joint loading through buoyancy to help patient improve posture without excess stress and pain. , Water will aid  with movement using the current and laminar flow while the buoyancy reduces weight bearing, and Hydrostatic pressure also supports joints by unweighting joint load by at least 50 % in 3-4 feet depth water. 80% in chest to neck deep water.  Water walking warm up forward, back and side x 6 widths Seated stretching hamstrings, gastroc, glute and adductors  Exercises and aerobic capacity challenge: Long leg hip flex slow 2 x 10, quick x 10 Long leg hip extension 2 x 10 slow; 10 quick Long leg flex then add x 10  Long leg abd then ext x 10  Step ups bottom step R/L forward x10.  Attempted restro with increased discomfort L knee Pilates: lat pull 2 x10 yellow noodle; plank x3 holding 30 seconds; swimming x5 Introduced side to side pendulums using 2.5 blue foam hand buoys. Multiple attempts to gain psosition and execute. Cycling suspended with hand buoys 20 fast revolutions then 20 slow x 5 trials.  11-15 AquaticREHABdocumentation: Water will reduce edema around joints through hydrostatic pressure that allows for improved ROM, Water will allow for reduced gait deviation due to reduced joint loading through buoyancy to help patient improve posture without excess stress and pain. , Water will aid with movement using the current and laminar flow while the  buoyancy reduces weight bearing, and Hydrostatic pressure also supports joints by unweighting joint load by at least 50 % in 3-4 feet depth water. 80% in chest to neck deep water.  Water walking warm up forward, back and side x 6 widths Seated stretching hamstrings, gastroc, glute and adductors Long leg hip flex slow 2 x 10, quick x 10 Long leg hip extension 2 x 10 slow Long leg add x 10 slow Step ups submerged to chest x10 R/L forward and side stepping Pilates: lat pull 2 x10; swimming x10;plank x3 holding for up to 30 seconds; reverse plank multiple attempts to gain position  11/3 AquaticREHABdocumentation: Water will reduce edema around joints through hydrostatic pressure that allows for improved ROM, Water will allow for reduced gait deviation due to reduced joint loading through buoyancy to help patient improve posture without excess stress and pain. , Water will aid with movement using the current and laminar flow while the buoyancy reduces weight bearing, and Hydrostatic pressure also supports joints by unweighting joint load by at least 50 % in 3-4 feet depth water. 80% in chest to neck deep water.   Walking for warmup  Wall sit at side of pool: noodle behind knee press downs, noodle behind ankle: horiz abd/add HS stretch, long axis circles  Facing wall: quad stretch & knee flexion stretch with noodle under dorsum of foot, HSS foot on wall  Plank series, cuffs on ankle, belt under waist: alt hip flexion, alt knee pull  Floating V series: iso holds, LE abd/add, UE horiz abd/add  SLS with hip abd- speed for resistance from water    Eval SLS with level pelvis- prog to slow walking with hip abd activation     PATIENT EDUCATION:  Education details: exercise form/rationale   Person educated: Patient Education method: Programmer, multimedia, Demonstration, Tactile cues, Verbal cues, and Handouts Education comprehension: verbalized understanding, returned demonstration, verbal cues required,  tactile cues required, and needs further education     HOME EXERCISE PROGRAM: [id number] SLS with level pelvis Added aquatics 11/17   ASSESSMENT:   CLINICAL IMPRESSION: Improved sitting balance ability which translated to improved core strengthening.  Continued with proximal strengthening for improved distal control. Added to glute strengthening using  water bands. Pt reporting some discomfort in knee but much better.  Has decided to have Cortizone shot, will call to schedule.      REHAB POTENTIAL: Good   CLINICAL DECISION MAKING: Evolving/moderate complexity   EVALUATION COMPLEXITY: Moderate     GOALS: Goals reviewed with patient? Yes   SHORT TERM GOALS:   STG Name Target Date Goal status  1 Resolution of trendelenburg gait pattern Baseline: mild at eval 10/11/2021 achieved                                                          LONG TERM GOALS:    LTG Name Target Date Goal status  1 Tolerate standing for market Baseline: 11/15/2021 INITIAL  2 Pt will report reduced use of shifted posture to RLE as compensation Baseline: 11/15/2021 INITIAL  3 Knee pain to <=3/10 during functional daily activities Baseline:ranges from 3-6 on average with increases due to activity 11/15/2021 INITIAL  4 Pt will only require knee ice 1/day for pain control Baseline:multiple a day requried at eval 11/15/2021 INITIAL  5 Pt will be able to navigate stairs with max of mild discomfort Baseline: significant at eval 11/15/21 Initial    PT FREQUENCY: 1-2x/week   will adapt for changes in work schedule   PT DURATION: 8 weeks   PLANNED INTERVENTIONS: Therapeutic exercises, Therapeutic activity, Neuro Muscular re-education, Balance training, Gait training, Patient/Family education, Joint mobilization, Stair training, Aquatic Therapy, Dry Needling, Cryotherapy, Moist heat, and Manual therapy   PLAN FOR NEXT SESSION:  glut strengtheing    Corrie Dandy Tomma Lightning) Ivey Cina MPT 11/07/21 1:39 PM

## 2021-11-07 NOTE — Telephone Encounter (Signed)
Pt called back, 12-15 was offered [place].  When pt was told that we would be going beyond mid Feb for an office visit she said she will look into another provider and ended the call.

## 2021-11-07 NOTE — Telephone Encounter (Signed)
Spoke with the patient and was able to get her scheduled with Shanda Bumps NP on Monday December 5th at 9:15 AM for a mychart video visit. Shanda Bumps NP aware of the insurance requirement for this visit. Pt appreciative.

## 2021-11-11 ENCOUNTER — Telehealth (INDEPENDENT_AMBULATORY_CARE_PROVIDER_SITE_OTHER): Payer: Medicare Other | Admitting: Adult Health

## 2021-11-11 ENCOUNTER — Encounter: Payer: Self-pay | Admitting: Adult Health

## 2021-11-11 DIAGNOSIS — G4733 Obstructive sleep apnea (adult) (pediatric): Secondary | ICD-10-CM | POA: Diagnosis not present

## 2021-11-11 NOTE — Progress Notes (Signed)
[facility] [address] [place] [place] [address] [phone]       OFFIEC FOLLOW UP NOTE  [name] Date of Birth:  Jun 23, 1956 Medical Record Number:  [id number]   Reason for visit: sleep apnea - change in insurance  Virtual Visit via Video Note  Virtual visit completed through MyChart, a video enabled telemedicine application. Due to national recommendations of social distancing due to COVID-19, a virtual visit is felt to be most appropriate for this patient at this time. Reviewed limitations, risks, security and privacy concerns of performing a virtual visit and the availability of in person appointments. I also reviewed that there may be a patient responsible charge related to this service. The patient agreed to proceed.    Patient location: home Provider location: in office, [facility] Persons participating in this virtual visit: patient    SUBJECTIVE:   HPI:   Update 11/11/2021 JM: Completed HST 10/14/2021 which showed severe OSA with total AHI of 23/h and O2 nadir of 83% with evidence of nocturnal hypoxemia and time below 89% saturation over 90 minutes.  Recommended proceeding with AutoPap titration/trial at home and titration study in the future if indicated.  Prior to obtaining new CPAP machine, insurance was switched to Medicare which required a new visit and prescription/order per insurance requirements.     Initial visit 09/16/2021 Dr. Frances Furbish  [name] is a 65 year old right-handed woman with an underlying medical history of gastritis, hiatal hernia, hypertension, hyperlipidemia, hyperglycemia, osteoarthritis, depression, anxiety, and obesity, who reports snoring and excessive daytime somnolence.  I reviewed the office note from 06/21/2021.  Her Epworth sleepiness score is 8/24, fatigue severity score is 33/63.  She is not aware of any family history of sleep apnea.  She lives alone.  She has a dog in the household,  does not have a TV in her bedroom.  She works from home, she is Writer.  She sees orthopedics for left knee pain.  She is status post injections as well as arthroscopic knee surgery some 4 months ago.  She is followed by [facility].  She is on gabapentin as needed.  She also takes BuSpar and Effexor.  She has had weight gain over time.  She has nocturia about twice per average night, bedtime generally between 10 PM and 11 PM typically, sometimes as late as 11:30 PM.  Rise time is generally between 8 and 8:30 AM.  She has 1 grown son who is 26 years old.  She does take a nap during the day.  She has woken herself up with a sense of snorting.  She is a non-smoker and drinks alcohol about 3-4 times a week, up to 3 drinks at a time.  She drinks caffeine in the form of coffee, generally 2 cups of coffee in the morning and the occasional soda during the day.      ROS:   14 system review of systems performed and negative with exception of no complaints   PMH:  Past Medical History:  Diagnosis Date   Anxiety    Depression    Gastritis    Hiatal hernia    Hyperglycemia    Hyperlipidemia    Hypertension    Insomnia    OA (osteoarthritis)    Varicose veins of bilateral lower extremities with pain     PSH:  Past Surgical History:  Procedure Laterality Date   DILATION AND CURETTAGE OF UTERUS     EAR SURGERY  1993   FOOT FRACTURE SURGERY  11/2002   hx of gum abscess, I&D     PATELLA FRACTURE SURGERY  11/17/2018   2 screws    Social History:  Social History   Socioeconomic History   Marital status: Divorced    Spouse name: Not on file   Number of children: Not on file   Years of education: Not on file   Highest education level: Not on file  Occupational History   Not on file  Tobacco Use   Smoking status: Former    Types: Cigarettes    Quit date: 11/08/2003    Years since quitting: 18.0   Smokeless tobacco: Never  Vaping Use   Vaping Use: Never used   Substance and Sexual Activity   Alcohol use: Yes    Comment: 3x/week 1-2 drinks   Drug use: Never   Sexual activity: Not on file  Other Topics Concern   Not on file  Social History Narrative   Caffeine 1-2 daily in am.     Social Determinants of Health   Financial Resource Strain: Not on file  Food Insecurity: Not on file  Transportation Needs: Not on file  Physical Activity: Not on file  Stress: Not on file  Social Connections: Not on file  Intimate Partner Violence: Not on file    Family History:  Family History  Problem Relation Age of Onset   Osteoarthritis Mother    Memory loss Mother    Stomach cancer Father    CAD Father    Colon cancer Other     Medications:   Current Outpatient Medications on File Prior to Visit  Medication Sig Dispense Refill   atorvastatin (LIPITOR) 80 MG tablet Take 80 mg by mouth daily.  2   busPIRone (BUSPAR) 30 MG tablet Take 40 mg by mouth daily.     Calcium Carb-Cholecalciferol (CALCIUM+D3 PO) Take 1 tablet by mouth daily.     Glucosamine HCl 1500 MG TABS Take 1,500 mg by mouth daily.     lisinopril-hydrochlorothiazide (PRINZIDE,ZESTORETIC) 20-12.5 MG tablet Take 1 tablet by mouth daily.  6   magnesium oxide (MAG-OX) 400 MG tablet Take 1,200 mg by mouth at bedtime.     venlafaxine XR (EFFEXOR-XR) 150 MG 24 hr capsule TAKE 2 CAPSULES BY MOUTH EVERY MORNING WITH FOOD  1   No current facility-administered medications on file prior to visit.    Allergies:  No Known Allergies    OBJECTIVE:  Physical Exam *Limited due to visit type* General: well developed, well nourished, pleasant middle-age Caucasian female, seated, in no evident distress Head: head normocephalic and atraumatic.             ASSESSMENT/PLAN: Patricia Mendoza is a 65 y.o. year old female followed by Dr. Frances Furbish with new diagnosis of severe sleep apnea (HST completed 10/14/2021) and recommended initiating AutoPap.  Prior to obtaining CPAP machine, insurance  is switched to Medicare requiring a new office visit and prescription.   Severe OSA New order placed to DME [facility] to start CPAP for severe sleep apnea with AHI 43/h Initial CPAP compliance visit scheduled with Dr. Frances Furbish on 01/28/2022     Orders Placed This Encounter  Procedures   For home use only DME continuous positive airway pressure (CPAP)    Rx:  Please start Patricia Mendoza on Auto-CPAP at 5-14 cwp with heated humidity.  Mask: Nasal mask with chinstrap if needed, size mask to fit.  CPAP related supplies, such as filters, hose,  headgear as needed    Order Specific Question:   Length of Need    Answer:   Lifetime    Order Specific Question:   Patient has OSA or probable OSA    Answer:   Yes    Order Specific Question:   Is the patient currently using CPAP in the home    Answer:   No    Order Specific Question:   If no (to question two) date of sleep study    Answer:   10/14/2021    Order Specific Question:   Date of face to face encounter    Answer:   11/11/2021    Order Specific Question:   Settings    Answer:   Autotitration    Order Specific Question:   CPAP supplies needed    Answer:   Mask, headgear, cushions, filters, heated tubing and water chamber      I spent 12 minutes of face-to-face and non-face-to-face time with patient via MyChart video visit.  This included previsit chart review, study review, order entry, electronic health record documentation, and patient discussion regarding indication for initiating CPAP for adequate treatment of severe sleep apnea and answered all questions to patient's satisfaction   Ihor Austin, [facility]  [facility] [address] [place], [place] [address]  Phone [phone] Fax [phone] Note: This document was prepared with digital dictation and possible smart phrase technology. Any transcriptional errors that result from this process are unintentional.

## 2021-11-11 NOTE — Telephone Encounter (Signed)
Per Morrie Sheldon w/ [facility], pt has been scheduled to get her CPAP on 12/12 @ 9 am.

## 2021-11-11 NOTE — Progress Notes (Signed)
Auto-cpap order has been sent to [facility].

## 2021-11-12 ENCOUNTER — Encounter (HOSPITAL_BASED_OUTPATIENT_CLINIC_OR_DEPARTMENT_OTHER): Payer: Self-pay | Admitting: Physical Therapy

## 2021-11-12 ENCOUNTER — Ambulatory Visit (HOSPITAL_BASED_OUTPATIENT_CLINIC_OR_DEPARTMENT_OTHER): Payer: Medicare Other | Admitting: Physical Therapy

## 2021-11-12 ENCOUNTER — Other Ambulatory Visit: Payer: Self-pay

## 2021-11-12 DIAGNOSIS — G8929 Other chronic pain: Secondary | ICD-10-CM

## 2021-11-12 DIAGNOSIS — M25562 Pain in left knee: Secondary | ICD-10-CM | POA: Diagnosis not present

## 2021-11-12 NOTE — Therapy (Signed)
OUTPATIENT PHYSICAL THERAPY TREATMENT NOTE   Patient Name: Patricia Mendoza MRN: [id number] DOB:March 12, 1956, 65 y.o., female Today's Date: 11/12/2021  PCP: Creola Corn, MD REFERRING PROVIDER: Salvatore Marvel, MD   PT End of Session - 11/12/21 [phone]     Visit Number 8    Number of Visits 17    Date for PT Re-Evaluation 11/15/21    Authorization Type BCBS    PT Start Time 0903    PT Stop Time 0945    PT Time Calculation (min) 42 min    Equipment Utilized During Treatment Other (comment)    Activity Tolerance Patient tolerated treatment well    Behavior During Therapy WFL for tasks assessed/performed             Past Medical History:  Diagnosis Date   Anxiety    Depression    Gastritis    Hiatal hernia    Hyperglycemia    Hyperlipidemia    Hypertension    Insomnia    OA (osteoarthritis)    Varicose veins of bilateral lower extremities with pain    Past Surgical History:  Procedure Laterality Date   DILATION AND CURETTAGE OF UTERUS     EAR SURGERY  1993   FOOT FRACTURE SURGERY  11/2002   hx of gum abscess, I&D     PATELLA FRACTURE SURGERY  11/17/2018   2 screws   Patient Active Problem List   Diagnosis Date Noted   Abnormal posture 11/05/2021     REFERRING DIAG: s/p Lt knee scope, OA  THERAPY DIAG:  Chronic pain of left knee  PERTINENT HISTORY: Lt foot fx surgery in 2003, L4-5 arthritis and buldging  PRECAUTIONS: none  SUBJECTIVE: "Doing ok"  PAIN:  Are you having pain? yes VAS scale: 1/10 with l Knee flex at 90 deg.  0/10 resting or <90 Degrees of knee flex  11/29 Pain location: Lt knee  Aggravating factors: stairs Relieving factors: rest    OBJECTIVE:  PATIENT SURVEYS:  LEFS 43/80   COGNITION:          Overall cognitive status: Within functional limits for tasks assessed                        SENSATION:          WFL   MUSCLE LENGTH: Good flexibility bilaterally   POSTURE:  Mild incr in kyphosis, good LE alignment   LE  AROM/PROM:   WFL   LE MMT:   Gross hip strength 5/5 bilat Unable to demo pelvic alignment in SLS       GAIT:   Comments: mild Rt trendelenburg in stance phase       TODAY'S TREATMENT:  12/6 AquaticREHABdocumentation: Water will reduce edema around joints through hydrostatic pressure that allows for improved ROM, Water will allow for reduced gait deviation due to reduced joint loading through buoyancy to help patient improve posture without excess stress and pain. , Water will aid with movement using the current and laminar flow while the buoyancy reduces weight bearing, and Hydrostatic pressure also supports joints by unweighting joint load by at least 50 % in 3-4 feet depth water. 80% in chest to neck deep water.  Water walking warm up forward, back and side x 6 widths Seated stretching hamstrings, gastroc, glute and adductors -knee kicking 2 x 30 reps -hip flex kicking with tight core for abdominal engagement   Exercises and aerobic capacity challenge:added 3 lb weights Long leg hip flex  slow 2 x 15, quick x 20 Long leg hip extension 2 x 15 slow; 20 quick Long leg add/abd x12 Long leg hip extension then add x 12   side to side pendulums using yellow buoys 2 x 10 and added 3 lb ankle weights.Increased speed with last trial for increased resistance.  Walking in 4 ft with 3 lb weight and yellow hand buoys submerged fwd and retro x 4 widths.  Standing 3 lb weight Step ups in 4 ft frw,retro and side x 10 Standing on bottom water step SL squats lle and squats x 15  Water walking between all exercises  backward for stretching, recovery and glute engagement . 12/1 AquaticREHABdocumentation: Water will reduce edema around joints through hydrostatic pressure that allows for improved ROM, Water will allow for reduced gait deviation due to reduced joint loading through buoyancy to help patient improve posture without excess stress and pain. , Water will aid with movement using the  current and laminar flow while the buoyancy reduces weight bearing, and Hydrostatic pressure also supports joints by unweighting joint load by at least 50 % in 3-4 feet depth water. 80% in chest to neck deep water.  Water walking warm up forward, back and side x 6 widths Seated stretching hamstrings, gastroc, glute and adductors   Exercises and aerobic capacity challenge: Long leg hip flex slow 2 x 12, quick x 15 Long leg hip extension 2 x 12 slow; 20 quick Long leg add/abd x12 Long leg hip extension then add x 12   Pilates:  Reverse plank x 5   side to side pendulums using yellow buoys 2 x 10. Cues for abdominal  and QL focus  and execution/positioning. Increased speed with last trial for increased resistance.  Balance challenges -sitting on yellow noodle with hands raised x3 holding for up to 30 seconds. Improved execution and balance abilty  Standing Resisted hip extension and abd/add using water bands  Water walking between all exercises  backward for stretching, recovery and glute engagement .   11/29 AquaticREHABdocumentation: Water will reduce edema around joints through hydrostatic pressure that allows for improved ROM, Water will allow for reduced gait deviation due to reduced joint loading through buoyancy to help patient improve posture without excess stress and pain. , Water will aid with movement using the current and laminar flow while the buoyancy reduces weight bearing, and Hydrostatic pressure also supports joints by unweighting joint load by at least 50 % in 3-4 feet depth water. 80% in chest to neck deep water.  Water walking warm up forward, back and side x 6 widths Seated stretching hamstrings, gastroc, glute and adductors   Exercises and aerobic capacity challenge: Long leg hip flex slow 2 x 10, quick x 10 Long leg hip extension 2 x 10 slow; 10 quick Long leg add/abd x10   Pilates:  Reverse plank x 5   side to side pendulums using 2.5 blue foam hand  buoys. 2x10 excellent execution Sup to prone using yellow buoys 2 x 10. Cues for abdominal focus and execution/positioning.  Balance challenges -sitting on yellow noodle multiple trials.  Standing Step ups forward, and side stepping x 10 Retro with discomfort Squats x 10 on bottom water step.  Water walking between all exercises for stretching and recovery.     Eval SLS with level pelvis- prog to slow walking with hip abd activation     PATIENT EDUCATION:  Education details: exercise form/rationale   Person educated: Patient Education method: Explanation, Demonstration, Tactile  cues, Verbal cues, and Handouts Education comprehension: verbalized understanding, returned demonstration, verbal cues required, tactile cues required, and needs further education     HOME EXERCISE PROGRAM: [id number] SLS with level pelvis Added aquatics 11/17. Laminated 12/6   ASSESSMENT:   CLINICAL IMPRESSION: Improving strength in LE as added 3 lb weight to LE exercises.  Pt reports pain in lat aspect of left knee when bent and loaded at 90 degrees. Otherwise no discomfort. Pt with excellent toleration to increased resistance today. She remains in motion for entire 45 mins demonstrating improved aerobic capacity. Will begin instructing on final HEP for dc planning      REHAB POTENTIAL: Good   CLINICAL DECISION MAKING: Evolving/moderate complexity   EVALUATION COMPLEXITY: Moderate     GOALS: Goals reviewed with patient? Yes   SHORT TERM GOALS:   STG Name Target Date Goal status  1 Resolution of trendelenburg gait pattern Baseline: mild at eval 10/11/2021 achieved                                                          LONG TERM GOALS:    LTG Name Target Date Goal status  1 Tolerate standing for market Baseline: 11/15/2021 INITIAL  2 Pt will report reduced use of shifted posture to RLE as compensation Baseline: 11/15/2021 INITIAL  3 Knee pain to <=3/10 during functional daily  activities Baseline:ranges from 3-6 on average with increases due to activity 11/15/2021 INITIAL Achieved 12/6  4 Pt will only require knee ice 1/day for pain control Baseline:multiple a day requried at eval 11/15/2021 INITIAL  5 Pt will be able to navigate stairs with max of mild discomfort Baseline: significant at eval 11/15/21 Initial    PT FREQUENCY: 1-2x/week   will adapt for changes in work schedule   PT DURATION: 8 weeks   PLANNED INTERVENTIONS: Therapeutic exercises, Therapeutic activity, Neuro Muscular re-education, Balance training, Gait training, Patient/Family education, Joint mobilization, Stair training, Aquatic Therapy, Dry Needling, Cryotherapy, Moist heat, and Manual therapy   PLAN FOR NEXT SESSION:  glut strengtheing    Corrie Dandy Tomma Lightning) Colisha Redler MPT 11/12/21 1:45 PM

## 2021-11-19 ENCOUNTER — Telehealth: Payer: Self-pay | Admitting: Neurology

## 2021-11-19 NOTE — Telephone Encounter (Signed)
DTO:IZTIW   Phone: [phone] [phone] Equipment Issued: Patrina Levering w/modem 11-18-2021(set up date) Pt to be scheduled  between 12-18-2021 thru 02-16-2022

## 2021-11-26 ENCOUNTER — Ambulatory Visit (HOSPITAL_BASED_OUTPATIENT_CLINIC_OR_DEPARTMENT_OTHER): Payer: Medicare Other | Admitting: Physical Therapy

## 2021-11-28 ENCOUNTER — Ambulatory Visit (HOSPITAL_BASED_OUTPATIENT_CLINIC_OR_DEPARTMENT_OTHER): Payer: Medicare Other | Admitting: Physical Therapy

## 2021-12-03 ENCOUNTER — Other Ambulatory Visit: Payer: Self-pay

## 2021-12-03 ENCOUNTER — Encounter (HOSPITAL_BASED_OUTPATIENT_CLINIC_OR_DEPARTMENT_OTHER): Payer: Self-pay | Admitting: Physical Therapy

## 2021-12-03 ENCOUNTER — Ambulatory Visit (HOSPITAL_BASED_OUTPATIENT_CLINIC_OR_DEPARTMENT_OTHER): Payer: Medicare Other | Admitting: Physical Therapy

## 2021-12-03 DIAGNOSIS — M25562 Pain in left knee: Secondary | ICD-10-CM | POA: Diagnosis not present

## 2021-12-03 NOTE — Therapy (Signed)
OUTPATIENT PHYSICAL THERAPY TREATMENT NOTE   Patient Name: Patricia Mendoza MRN: [id number] DOB:Aug 03, 1956, 65 y.o., female Today's Date: 12/04/2021  PCP: Creola Corn, MD REFERRING PROVIDER: Salvatore Marvel, MD   PT End of Session - 12/03/21 1701     Visit Number 9    Number of Visits 13    Date for PT Re-Evaluation 12/17/21    Authorization Type BCBS    PT Start Time 1526    PT Stop Time 1606    PT Time Calculation (min) 40 min    Activity Tolerance Patient tolerated treatment well    Behavior During Therapy WFL for tasks assessed/performed              Past Medical History:  Diagnosis Date   Anxiety    Depression    Gastritis    Hiatal hernia    Hyperglycemia    Hyperlipidemia    Hypertension    Insomnia    OA (osteoarthritis)    Varicose veins of bilateral lower extremities with pain    Past Surgical History:  Procedure Laterality Date   DILATION AND CURETTAGE OF UTERUS     EAR SURGERY  1993   FOOT FRACTURE SURGERY  11/2002   hx of gum abscess, I&D     PATELLA FRACTURE SURGERY  11/17/2018   2 screws   Patient Active Problem List   Diagnosis Date Noted   Abnormal posture 11/05/2021     REFERRING DIAG: s/p Lt knee scope, OA  THERAPY DIAG:  Chronic pain of left knee  PERTINENT HISTORY: Lt foot fx surgery in 2003, L4-5 arthritis and buldging  PRECAUTIONS: none  SUBJECTIVE: Pt states she is feeling much better.  Pt reports improved sx's with cortisone shot and PT.  Pt is able to walk much more with less swelling.     PAIN:  Are you having pain? yes VAS scale: 0/10 currently Pain location: Lt knee  Aggravating factors: stairs Relieving factors: rest    OBJECTIVE:        TODAY'S TREATMENT:  12/6 AquaticREHABdocumentation: Water will reduce edema around joints through hydrostatic pressure that allows for improved ROM, Water will allow for reduced gait deviation due to reduced joint loading through buoyancy to help patient improve  posture without excess stress and pain. , Water will aid with movement using the current and laminar flow while the buoyancy reduces weight bearing, and Hydrostatic pressure also supports joints by unweighting joint load by at least 50 % in 3-4 feet depth water. 80% in chest to neck deep water.  Water walking warm up forward, back and side x 6 widths  Long leg hip flex slow 2 x 15, quick x 20 Long leg hip extension 2 x 15 slow; 20 quick Long leg add/abd 2x12  Squats 2x10 reps   Step ups 2x10 reps  Lateral step ups 2x10 reps  Retro step ups 2x10 reps  Reverse plank 2x1 min Plank  2x1 min side to side pendulums using yellow buoys.  Increased speed with last trial for increased resistance.  Balance challenges -sitting on yellow noodle  Water walking between exercises       PATIENT EDUCATION:  Education details: exercise form/rationale  Person educated: Patient Education method: Explanation, Demonstration, Tactile cues, Verbal cues, and Handouts Education comprehension: verbalized understanding, returned demonstration, verbal cues required, tactile cues required, and needs further education     HOME EXERCISE PROGRAM: [id number] SLS with level pelvis Added aquatics 11/17. Laminated 12/6   ASSESSMENT:   CLINICAL  IMPRESSION: Pt is progressing well and reports she is feeling much better.  She reports being able to ambulate further with less swelling.  Pt has a good understanding of her aquatic exercises and performed aquatic exercises well.  Pt responded well to Rx having no pain after Rx.      REHAB POTENTIAL: Good   CLINICAL DECISION MAKING: Evolving/moderate complexity   EVALUATION COMPLEXITY: Moderate     GOALS: Goals reviewed with patient? Yes   SHORT TERM GOALS:   STG Name Target Date Goal status  1 Resolution of trendelenburg gait pattern Baseline: mild at eval 10/11/2021 achieved                                                          LONG TERM GOALS:     LTG Name Target Date Goal status  1 Tolerate standing for market Baseline: 11/15/2021 INITIAL  2 Pt will report reduced use of shifted posture to RLE as compensation Baseline: 11/15/2021 INITIAL  3 Knee pain to <=3/10 during functional daily activities Baseline:ranges from 3-6 on average with increases due to activity 11/15/2021 INITIAL Achieved 12/6  4 Pt will only require knee ice 1/day for pain control Baseline:multiple a day requried at eval 11/15/2021 INITIAL  5 Pt will be able to navigate stairs with max of mild discomfort Baseline: significant at eval 11/15/21 Initial    PT FREQUENCY: 1-2 times per week   PT DURATION: 2 weeks   PLANNED INTERVENTIONS: Therapeutic exercises, Therapeutic activity, Neuro Muscular re-education, Balance training, Gait training, Patient/Family education, Joint mobilization, Stair training, Aquatic Therapy, Dry Needling, Cryotherapy, Moist heat, and Manual therapy   PLAN FOR NEXT SESSION:  Cert ended on 11/15/2021 and Pt has been absent from PT since 11/12/2021.  Assess gait and goals next visit.  Give Aquatic HEP handout.    Audie Clear III PT, DPT 12/04/21 5:15 PM

## 2021-12-05 ENCOUNTER — Ambulatory Visit (HOSPITAL_BASED_OUTPATIENT_CLINIC_OR_DEPARTMENT_OTHER): Payer: Medicare Other | Admitting: Physical Therapy

## 2021-12-05 ENCOUNTER — Other Ambulatory Visit: Payer: Self-pay

## 2022-01-24 ENCOUNTER — Telehealth: Payer: Self-pay | Admitting: Adult Health

## 2022-01-24 NOTE — Telephone Encounter (Signed)
Pt cancelling Initial CPAP, need to be reschedule between 12-18-2021 and 02-16-2022. There was not anything available for Dr. Frances Furbish or Shanda Bumps, NP.

## 2022-01-27 NOTE — Telephone Encounter (Signed)
I called the pt and left a vm advising we would look out for an appt for her. I did ask if she would call back about a timeframe or day that tends to work better for her.

## 2022-01-28 ENCOUNTER — Ambulatory Visit: Payer: BC Managed Care – PPO | Admitting: Neurology

## 2022-02-03 ENCOUNTER — Encounter: Payer: Self-pay | Admitting: Adult Health

## 2022-02-03 ENCOUNTER — Ambulatory Visit: Payer: Medicare Other | Admitting: Adult Health

## 2022-02-03 ENCOUNTER — Ambulatory Visit (INDEPENDENT_AMBULATORY_CARE_PROVIDER_SITE_OTHER): Payer: Medicare Other | Admitting: Adult Health

## 2022-02-03 VITALS — BP 126/90 | HR 85 | Ht 67.0 in | Wt 240.0 lb

## 2022-02-03 DIAGNOSIS — G4733 Obstructive sleep apnea (adult) (pediatric): Secondary | ICD-10-CM

## 2022-02-03 DIAGNOSIS — Z9989 Dependence on other enabling machines and devices: Secondary | ICD-10-CM | POA: Diagnosis not present

## 2022-02-03 NOTE — Progress Notes (Addendum)
[facility] [address] [place]. [place] [address] [phone]       OFFIEC FOLLOW UP NOTE  Patricia Mendoza Date of Birth:  07-30-1956 Medical Record Number:  [id number]   Primary neurologist: Dr. Frances Furbish Reason for visit: Initial CPAP compliance visit   SUBJECTIVE:  Chief complaint: Chief Complaint  Patient presents with   Obstructive Sleep Apnea    RM 3 alone Pt is well, CPAP fell last week and some setting got changed. Airflow feels the same but reading different numbers that before.        HPI:   Update 02/03/2022 JM: Returns for initial CPAP compliance visit.  Therapy start date 11/18/2021. Reports doing very well with machine and tolerating well with great improvement of daytime functioning, feeling more rested upon awakening and sleeping more soundly.  Unfortunately, her machine fell off her night stand the night of 2/20 - since then, feels like pressure has been different and her machine is giving different readings. She did have this evaluated by [facility] respiratory therapist who did not find any issues with current machine. She has continued to use nightly.   SD card compliance report from 1/29 -2/27 shows 29 out of 30 usage days with 23 days greater than 4 hours for 76.7% compliance.  Average usage 7 hours and 29 minutes.  Residual AHI 5.4.  Mean pressure 7.7 with average leak 5.6.  Modem compliance report from 1/29 -2/27 shows 28 out of 30 usage days with 22 days greater than 4 hours for 73.3% compliance.  Average usage 7 hours and 11 minutes.  Residual AHI 0.9. mean pressure 7.6 with average leak 5.8.      History provided for reference purposes only Update 11/11/2021 JM: Completed HST 10/14/2021 which showed severe OSA with total AHI of 23/h and O2 nadir of 83% with evidence of nocturnal hypoxemia and time below 89% saturation over 90 minutes.  Recommended proceeding with AutoPap titration/trial at home and titration study in the future if  indicated.  Prior to obtaining new CPAP machine, insurance was switched to Medicare which required a new visit and prescription/order per insurance requirements.   Initial visit 09/16/2021 Dr. Frances Furbish  Patricia Mendoza is a 66 year old right-handed woman with an underlying medical history of gastritis, hiatal hernia, hypertension, hyperlipidemia, hyperglycemia, osteoarthritis, depression, anxiety, and obesity, who reports snoring and excessive daytime somnolence.  I reviewed the office note from 06/21/2021.  Her Epworth sleepiness score is 8/24, fatigue severity score is 33/63.  She is not aware of any family history of sleep apnea.  She lives alone.  She has a dog in the household, does not have a TV in her bedroom.  She works from home, she is Writer.  She sees orthopedics for left knee pain.  She is status post injections as well as arthroscopic knee surgery some 4 months ago.  She is followed by [facility].  She is on gabapentin as needed.  She also takes BuSpar and Effexor.  She has had weight gain over time.  She has nocturia about twice per average night, bedtime generally between 10 PM and 11 PM typically, sometimes as late as 11:30 PM.  Rise time is generally between 8 and 8:30 AM.  She has 1 grown son who is 47 years old.  She does take a nap during the day.  She has woken herself up with a sense of snorting.  She is a non-smoker and drinks alcohol about 3-4 times a week, up  to 3 drinks at a time.  She drinks caffeine in the form of coffee, generally 2 cups of coffee in the morning and the occasional soda during the day.      ROS:   14 system review of systems performed and negative with exception of no complaints     PMH:  Past Medical History:  Diagnosis Date   Anxiety    Depression    Gastritis    Hiatal hernia    Hyperglycemia    Hyperlipidemia    Hypertension    Insomnia    OA (osteoarthritis)    Varicose veins of bilateral lower extremities with pain      PSH:  Past Surgical History:  Procedure Laterality Date   DILATION AND CURETTAGE OF UTERUS     EAR SURGERY  1993   FOOT FRACTURE SURGERY  11/2002   hx of gum abscess, I&D     PATELLA FRACTURE SURGERY  11/17/2018   2 screws    Social History:  Social History   Socioeconomic History   Marital status: Divorced    Spouse name: Not on file   Number of children: Not on file   Years of education: Not on file   Highest education level: Not on file  Occupational History   Not on file  Tobacco Use   Smoking status: Former    Types: Cigarettes    Quit date: 11/08/2003    Years since quitting: 18.2   Smokeless tobacco: Never  Vaping Use   Vaping Use: Never used  Substance and Sexual Activity   Alcohol use: Yes    Comment: 3x/week 1-2 drinks   Drug use: Never   Sexual activity: Not on file  Other Topics Concern   Not on file  Social History Narrative   Caffeine 1-2 daily in am.     Social Determinants of Health   Financial Resource Strain: Not on file  Food Insecurity: Not on file  Transportation Needs: Not on file  Physical Activity: Not on file  Stress: Not on file  Social Connections: Not on file  Intimate Partner Violence: Not on file    Family History:  Family History  Problem Relation Age of Onset   Osteoarthritis Mother    Memory loss Mother    Stomach cancer Father    CAD Father    Colon cancer Other     Medications:   Current Outpatient Medications on File Prior to Visit  Medication Sig Dispense Refill   atorvastatin (LIPITOR) 80 MG tablet Take 80 mg by mouth daily.  2   busPIRone (BUSPAR) 30 MG tablet Take 40 mg by mouth daily.     Calcium Carb-Cholecalciferol (CALCIUM+D3 PO) Take 1 tablet by mouth daily.     Glucosamine HCl 1500 MG TABS Take 1,500 mg by mouth daily.     lisinopril-hydrochlorothiazide (PRINZIDE,ZESTORETIC) 20-12.5 MG tablet Take 1 tablet by mouth daily.  6   magnesium oxide (MAG-OX) 400 MG tablet Take 1,200 mg by mouth at  bedtime.     Semaglutide (OZEMPIC, 0.25 OR 0.5 MG/DOSE, [place]) Inject 50 mg into the skin once a week.     venlafaxine XR (EFFEXOR-XR) 150 MG 24 hr capsule TAKE 2 CAPSULES BY MOUTH EVERY MORNING WITH FOOD  1   No current facility-administered medications on file prior to visit.    Allergies:  No Known Allergies    OBJECTIVE:  Today's Vitals   02/03/22 1324  BP: 126/90  Pulse: 85  Weight: 240 lb (108.9  kg)  Height: 5\' 7"  (1.702 m)   Body mass index is 37.59 kg/m.   Physical Exam General: well developed, well nourished, seated, in no evident distress Head: head normocephalic and atraumatic.   Neck: supple with no carotid or supraclavicular bruits Cardiovascular: regular rate and rhythm, no murmurs Musculoskeletal: no deformity Skin:  no rash/petichiae Vascular:  Normal pulses all extremities   Neurologic Exam Mental Status: Awake and fully alert. Oriented to place and time. Recent and remote memory intact. Attention span, concentration and fund of knowledge appropriate. Mood and affect appropriate.  Cranial Nerves: Pupils equal, briskly reactive to light. Extraocular movements full without nystagmus. Visual fields full to confrontation. Hearing intact. Facial sensation intact. Face, tongue, palate moves normally and symmetrically.  Motor: Normal bulk and tone. Normal strength in all tested extremity muscles Sensory.: intact to touch , pinprick , position and vibratory sensation.  Coordination: Rapid alternating movements normal in all extremities. Finger-to-nose and heel-to-shin performed accurately bilaterally. Gait and Station: Arises from chair without difficulty. Stance is normal. Gait demonstrates normal stride length and balance without use of AD. Tandem walk and heel toe without difficulty.  Reflexes: 1+ and symmetric. Toes downgoing.            ASSESSMENT/PLAN: Patricia Mendoza is a 66 y.o. year old female followed by Dr. 76 with new diagnosis of severe sleep  apnea (HST completed 10/14/2021 with AHI 43/h) and recommended initiating AutoPap.  Returns for initial CPAP compliance visit   Severe OSA Significant discrepancies between modem and SD card download since machine dropped as well as not adequately reporting usage time (see HPI). Will reach out to DME company for re-evaluation of her machine as obviously not working correctly Discussed importance of continued night time usage advised to contact DME for needed supplies or CPAP related concerns   Follow up in 6 months or call earlier if needed   Orders Placed This Encounter  Procedures   For home use only DME continuous positive airway pressure (CPAP)    Please evaluate machine - not functioning appropriately since machine fell on ground from bedside table  Continued supplies as indicated    Order Specific Question:   Length of Need    Answer:   Lifetime    Order Specific Question:   Patient has OSA or probable OSA    Answer:   Yes    Order Specific Question:   Date of face to face encounter    Answer:   02/03/2022    Order Specific Question:   Settings    Answer:   Autotitration    Order Specific Question:   CPAP supplies needed    Answer:   Mask, headgear, cushions, filters, heated tubing and water chamber      I spent 26 minutes of face-to-face and non-face-to-face time with patient.  This included previsit chart review, study review, order entry, electronic health record documentation, and patient discussion regarding OSA on CPAP, review and discussion re: compliance report and current issues with machines and answered all other questions to patients satisfaction    02/05/2022, [facility]  [facility] [address] [place], [place]  Phone [phone] Fax [phone] Note: This document was prepared with digital dictation and possible smart phrase technology. Any transcriptional errors that result from this process are  unintentional.

## 2022-03-05 ENCOUNTER — Other Ambulatory Visit (HOSPITAL_BASED_OUTPATIENT_CLINIC_OR_DEPARTMENT_OTHER): Payer: Self-pay

## 2022-03-05 MED ORDER — ZOSTER VAC RECOMB ADJUVANTED 50 MCG/0.5ML IM SUSR
INTRAMUSCULAR | 0 refills | Status: AC
  Filled 2022-03-05: qty 0.5, 1d supply, fill #0

## 2022-05-07 ENCOUNTER — Encounter: Payer: Self-pay | Admitting: Adult Health

## 2022-05-07 ENCOUNTER — Ambulatory Visit (INDEPENDENT_AMBULATORY_CARE_PROVIDER_SITE_OTHER): Payer: Medicare Other | Admitting: Adult Health

## 2022-05-07 VITALS — BP 126/77 | HR 72 | Ht 67.0 in | Wt 225.0 lb

## 2022-05-07 DIAGNOSIS — Z9989 Dependence on other enabling machines and devices: Secondary | ICD-10-CM

## 2022-05-07 DIAGNOSIS — G4733 Obstructive sleep apnea (adult) (pediatric): Secondary | ICD-10-CM | POA: Diagnosis not present

## 2022-05-07 NOTE — Progress Notes (Signed)
[facility] [address] [place]. [place] [address] [phone]       OFFIEC FOLLOW UP NOTE  Patricia Mendoza Date of Birth:  05-12-1956 Medical Record Number:  [id number]   Primary neurologist: Dr. Rexene Alberts Reason for visit: Initial CPAP compliance visit   SUBJECTIVE:  Chief complaint: Chief Complaint  Patient presents with   Obstructive Sleep Apnea    Rm 3 alone Pt is well and stable, no concerns with machine        HPI:    05/07/2022 JM: patient returns for CPAP compliance visit after she received a new machine as her old machine was not working properly.  Patient did not bring her machine to today's visit.  We were unable to obtain download through modem nor through DME company. She has had no issues since receiving her new machine and reports she is using CPAP nightly.  She continues to tolerate CPAP well and continues to feel benefit in regards to functioning during the day, having more energy and sleeping better at night.  She is currently working on weight loss and endorses approximately 20 pound weight loss since prior visit 3 months ago.  Currently using Ozempic.  No further concerns at this time.   ADDENDUM: Please see download report from the past 30 days which shows 29 out of 31 usage days with 27 days greater than 4 hours for 87.1% compliance.  Average usage 8 hours and 44 minutes.  Residual AHI 0.4.  Pressure in the 95 percentile 8.7 on pressure settings of 6-13.5 and ramp pressure 4.  Average leak 8.3.     History provided for reference purposes only Update 02/03/2022 JM: Returns for initial CPAP compliance visit.  Therapy start date 11/18/2021. Reports doing very well with machine and tolerating well with great improvement of daytime functioning, feeling more rested upon awakening and sleeping more soundly.  Unfortunately, her machine fell off her night stand the night of 2/20 - since then, feels like pressure has been different and her  machine is giving different readings. She did have this evaluated by [facility] respiratory therapist who did not find any issues with current machine. She has continued to use nightly.   SD card compliance report from 1/29 -2/27 shows 29 out of 30 usage days with 23 days greater than 4 hours for 76.7% compliance.  Average usage 7 hours and 29 minutes.  Residual AHI 5.4.  Mean pressure 7.7 with average leak 5.6.  Modem compliance report from 1/29 -2/27 shows 28 out of 30 usage days with 22 days greater than 4 hours for 73.3% compliance.  Average usage 7 hours and 11 minutes.  Residual AHI 0.9. mean pressure 7.6 with average leak 5.8.  Update 11/11/2021 JM: Completed HST 10/14/2021 which showed severe OSA with total AHI of 23/h and O2 nadir of 83% with evidence of nocturnal hypoxemia and time below 89% saturation over 90 minutes.  Recommended proceeding with AutoPap titration/trial at home and titration study in the future if indicated.  Prior to obtaining new CPAP machine, insurance was switched to Medicare which required a new visit and prescription/order per insurance requirements.   Initial visit 09/16/2021 Dr. Rexene Alberts  Patricia Mendoza is a 66 year old right-handed woman with an underlying medical history of gastritis, hiatal hernia, hypertension, hyperlipidemia, hyperglycemia, osteoarthritis, depression, anxiety, and obesity, who reports snoring and excessive daytime somnolence.  I reviewed the office note from 06/21/2021.  Her Epworth sleepiness score is 8/24, fatigue severity score is 33/63.  She is  not aware of any family history of sleep apnea.  She lives alone.  She has a dog in the household, does not have a TV in her bedroom.  She works from home, she is Tax adviser.  She sees orthopedics for left knee pain.  She is status post injections as well as arthroscopic knee surgery some 4 months ago.  She is followed by [facility].  She is on gabapentin as needed.  She also takes BuSpar and  Effexor.  She has had weight gain over time.  She has nocturia about twice per average night, bedtime generally between 10 PM and 11 PM typically, sometimes as late as 11:30 PM.  Rise time is generally between 8 and 8:30 AM.  She has 1 grown son who is 83 years old.  She does take a nap during the day.  She has woken herself up with a sense of snorting.  She is a non-smoker and drinks alcohol about 3-4 times a week, up to 3 drinks at a time.  She drinks caffeine in the form of coffee, generally 2 cups of coffee in the morning and the occasional soda during the day.      ROS:   14 system review of systems performed and negative with exception of no complaints     PMH:  Past Medical History:  Diagnosis Date   Anxiety    Depression    Gastritis    Hiatal hernia    Hyperglycemia    Hyperlipidemia    Hypertension    Insomnia    OA (osteoarthritis)    Varicose veins of bilateral lower extremities with pain     PSH:  Past Surgical History:  Procedure Laterality Date   DILATION AND CURETTAGE OF UTERUS     EAR SURGERY  1993   FOOT FRACTURE SURGERY  11/2002   hx of gum abscess, I&D     PATELLA FRACTURE SURGERY  11/17/2018   2 screws    Social History:  Social History   Socioeconomic History   Marital status: Divorced    Spouse name: Not on file   Number of children: Not on file   Years of education: Not on file   Highest education level: Not on file  Occupational History   Not on file  Tobacco Use   Smoking status: Former    Types: Cigarettes    Quit date: 11/08/2003    Years since quitting: 18.5   Smokeless tobacco: Never  Vaping Use   Vaping Use: Never used  Substance and Sexual Activity   Alcohol use: Yes    Comment: 3x/week 1-2 drinks   Drug use: Never   Sexual activity: Not on file  Other Topics Concern   Not on file  Social History Narrative   Caffeine 1-2 daily in am.     Social Determinants of Health   Financial Resource Strain: Not on file  Food  Insecurity: Not on file  Transportation Needs: Not on file  Physical Activity: Not on file  Stress: Not on file  Social Connections: Not on file  Intimate Partner Violence: Not on file    Family History:  Family History  Problem Relation Age of Onset   Osteoarthritis Mother    Memory loss Mother    Stomach cancer Father    CAD Father    Colon cancer Other     Medications:   Current Outpatient Medications on File Prior to Visit  Medication Sig Dispense Refill  atorvastatin (LIPITOR) 80 MG tablet Take 80 mg by mouth daily.  2   busPIRone (BUSPAR) 30 MG tablet Take 40 mg by mouth daily.     Calcium Carb-Cholecalciferol (CALCIUM+D3 PO) Take 1 tablet by mouth daily.     Glucosamine HCl 1500 MG TABS Take 1,500 mg by mouth daily.     lisinopril-hydrochlorothiazide (PRINZIDE,ZESTORETIC) 20-12.5 MG tablet Take 1 tablet by mouth daily.  6   magnesium oxide (MAG-OX) 400 MG tablet Take 1,200 mg by mouth at bedtime.     Semaglutide (OZEMPIC, 0.25 OR 0.5 MG/DOSE, [place]) Inject 50 mg into the skin once a week. 2.0 MG     venlafaxine XR (EFFEXOR-XR) 150 MG 24 hr capsule TAKE 2 CAPSULES BY MOUTH EVERY MORNING WITH FOOD  1   Zoster Vaccine Adjuvanted [facility]) injection Inject into the muscle. 0.5 mL 0   No current facility-administered medications on file prior to visit.    Allergies:  No Known Allergies    OBJECTIVE:  Today's Vitals   05/07/22 1537  BP: 126/77  Pulse: 72  Weight: 225 lb (102.1 kg)  Height: 5\' 7"  (1.702 m)   Body mass index is 35.24 kg/m.   Physical Exam General: well developed, well nourished, seated, in no evident distress Head: head normocephalic and atraumatic.   Neck: supple with no carotid or supraclavicular bruits Cardiovascular: regular rate and rhythm, no murmurs Musculoskeletal: no deformity Skin:  no rash/petichiae Vascular:  Normal pulses all extremities   Neurologic Exam Mental Status: Awake and fully alert. Oriented to place and time. Recent  and remote memory intact. Attention span, concentration and fund of knowledge appropriate. Mood and affect appropriate.  Cranial Nerves: Pupils equal, briskly reactive to light. Extraocular movements full without nystagmus. Visual fields full to confrontation. Hearing intact. Facial sensation intact. Face, tongue, palate moves normally and symmetrically.  Motor: Normal bulk and tone. Normal strength in all tested extremity muscles Sensory.: intact to touch , pinprick , position and vibratory sensation.  Coordination: Rapid alternating movements normal in all extremities. Finger-to-nose and heel-to-shin performed accurately bilaterally. Gait and Station: Arises from chair without difficulty. Stance is normal. Gait demonstrates normal stride length and balance without use of AD. Tandem walk and heel toe without difficulty.  Reflexes: 1+ and symmetric. Toes downgoing.            ASSESSMENT/PLAN: Patricia Mendoza is a 66 y.o. year old female followed by Dr. Rexene Alberts with new diagnosis of severe sleep apnea (HST completed 10/14/2021 with AHI 43/h) and recommended initiating AutoPap.  She recently received a new machine as her initial machine was not functioning properly.  She returns today for initial CPAP visit with new machine.   Severe OSA Unable to obtain download - advised patient to bring machine/SD card to office or to DME to obtain download - will ADDENDUM note once obtained ADDENDUM: Compliance report shows adequate usage with optimal residual AHI at 0.4. Continue current settings of CPAP at this time -we will make adjustments if indicated after review of compliance report Discussed importance of continued night time usage ensuring greater than 4 hours nightly for optimal benefit and per insurance purposes advised to contact DME for needed supplies or CPAP related concerns    Follow up in 6 months or call earlier if needed   Orders Placed This Encounter  Procedures   For home use  only DME continuous positive airway pressure (CPAP)    Order Specific Question:   Length of Need    Answer:  Lifetime    Order Specific Question:   Patient has OSA or probable OSA    Answer:   Yes    Order Specific Question:   Is the patient currently using CPAP in the home    Answer:   Yes    Order Specific Question:   Date of face to face encounter    Answer:   05/08/2022    Order Specific Question:   Settings    Answer:   Autotitration    Order Specific Question:   CPAP supplies needed    Answer:   Mask, headgear, cushions, filters, heated tubing and water chamber      I spent 21 minutes of face-to-face and non-face-to-face time with patient.  This included previsit chart review, study review, order entry, electronic health record documentation, and patient discussion regarding OSA on CPAP and importance of continue nightly usage as reported and answered all other questions to patients satisfaction    Frann Rider, [facility]  [facility] [address] [place], [place] [address]  Phone [phone] Fax [phone] Note: This document was prepared with digital dictation and possible smart phrase technology. Any transcriptional errors that result from this process are unintentional.

## 2022-05-08 NOTE — Telephone Encounter (Signed)
Pt contacted, she will bring machine Monday for download

## 2022-05-12 NOTE — Telephone Encounter (Signed)
Pt returned machine, DL was received and given to NP for review

## 2022-05-12 NOTE — Telephone Encounter (Signed)
Review compliance report.  Made an addendum to my recent OV note with compliance information.

## 2022-06-11 ENCOUNTER — Other Ambulatory Visit: Payer: Self-pay | Admitting: Obstetrics and Gynecology

## 2022-06-11 DIAGNOSIS — Z1231 Encounter for screening mammogram for malignant neoplasm of breast: Secondary | ICD-10-CM

## 2022-06-20 ENCOUNTER — Ambulatory Visit
Admission: RE | Admit: 2022-06-20 | Discharge: 2022-06-20 | Disposition: A | Discharge status: Home / Self Care | Payer: Medicare Other | Source: Ambulatory Visit | Attending: Obstetrics and Gynecology | Admitting: Obstetrics and Gynecology

## 2022-06-20 DIAGNOSIS — Z1231 Encounter for screening mammogram for malignant neoplasm of breast: Secondary | ICD-10-CM

## 2022-07-09 ENCOUNTER — Other Ambulatory Visit (HOSPITAL_BASED_OUTPATIENT_CLINIC_OR_DEPARTMENT_OTHER): Payer: Self-pay

## 2022-07-09 MED ORDER — OZEMPIC (2 MG/DOSE) 8 MG/3ML ~~LOC~~ SOPN
PEN_INJECTOR | SUBCUTANEOUS | 6 refills | Status: AC
  Filled 2022-07-09: qty 3, 28d supply, fill #0
  Filled 2022-08-06: qty 3, 28d supply, fill #1
  Filled 2022-09-01: qty 3, 28d supply, fill #2

## 2022-07-10 ENCOUNTER — Other Ambulatory Visit (HOSPITAL_BASED_OUTPATIENT_CLINIC_OR_DEPARTMENT_OTHER): Payer: Self-pay

## 2022-07-10 MED ORDER — OZEMPIC (2 MG/DOSE) 8 MG/3ML ~~LOC~~ SOPN
PEN_INJECTOR | SUBCUTANEOUS | 3 refills | Status: AC
  Filled 2022-09-01: qty 9, 84d supply, fill #0
  Filled 2022-10-02: qty 3, 28d supply, fill #0
  Filled 2022-10-31: qty 3, 28d supply, fill #1
  Filled 2022-12-02: qty 3, 28d supply, fill #2
  Filled 2022-12-11 – 2023-02-12 (×4): qty 3, 28d supply, fill #3

## 2022-08-06 ENCOUNTER — Other Ambulatory Visit (HOSPITAL_BASED_OUTPATIENT_CLINIC_OR_DEPARTMENT_OTHER): Payer: Self-pay

## 2022-09-01 ENCOUNTER — Other Ambulatory Visit (HOSPITAL_BASED_OUTPATIENT_CLINIC_OR_DEPARTMENT_OTHER): Payer: Self-pay

## 2022-09-03 ENCOUNTER — Other Ambulatory Visit (HOSPITAL_BASED_OUTPATIENT_CLINIC_OR_DEPARTMENT_OTHER): Payer: Self-pay

## 2022-09-03 MED ORDER — ZOSTER VAC RECOMB ADJUVANTED 50 MCG/0.5ML IM SUSR
INTRAMUSCULAR | 0 refills | Status: AC
Start: 2022-09-03 — End: ?
  Filled 2022-09-03: qty 0.5, 1d supply, fill #0

## 2022-10-02 ENCOUNTER — Other Ambulatory Visit (HOSPITAL_BASED_OUTPATIENT_CLINIC_OR_DEPARTMENT_OTHER): Payer: Self-pay

## 2022-10-30 IMAGING — MG MM DIGITAL SCREENING BILAT W/ TOMO AND CAD
8 series · 8 of 24 positions shown · non-contrast
Comparison: Previous exam(s).

ACR Breast Density Category a: The breast tissue is almost entirely
fatty.

CLINICAL DATA: Screening.

EXAM:
DIGITAL SCREENING BILATERAL MAMMOGRAM WITH TOMOSYNTHESIS AND CAD
TECHNIQUE: Bilateral screening digital craniocaudal and mediolateral oblique
mammograms were obtained. Bilateral screening digital breast
tomosynthesis was performed. The images were evaluated with
computer-aided detection.

[L MLO synth-2D]
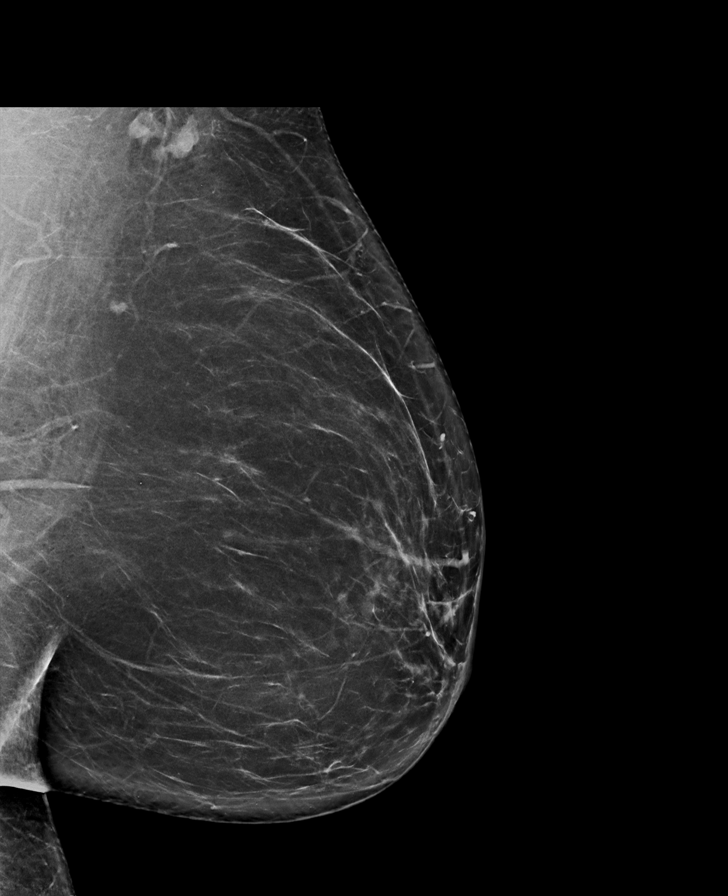

[R CC synth-2D]
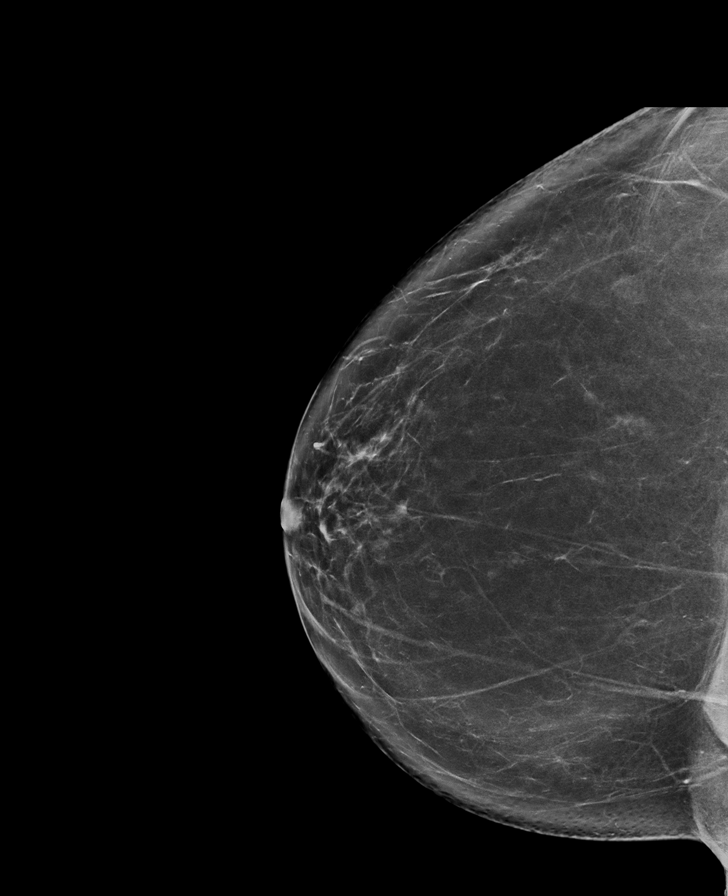

[R MLO synth-2D]
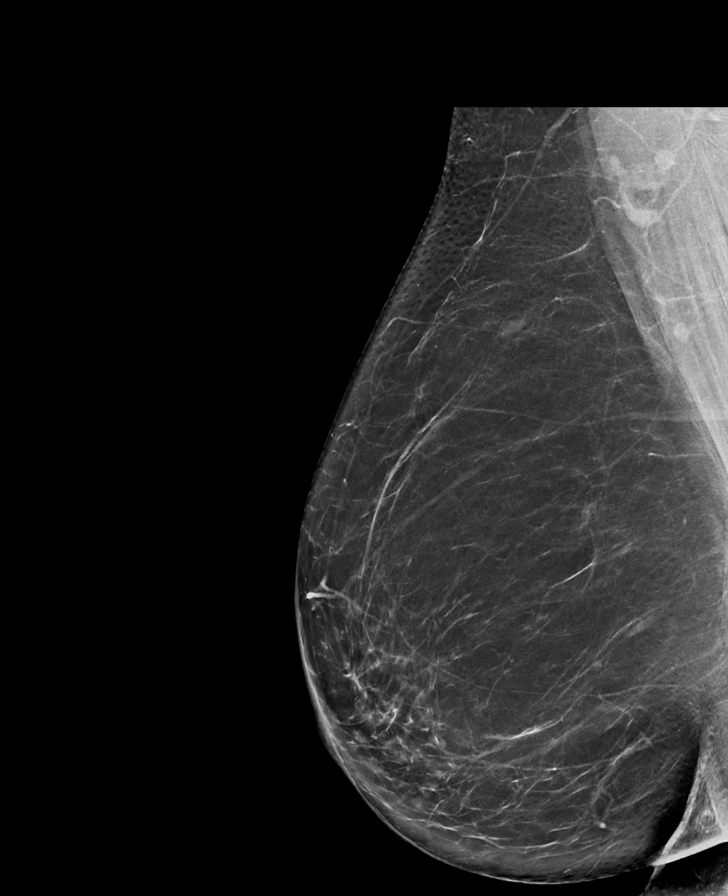

[L CC synth-2D]
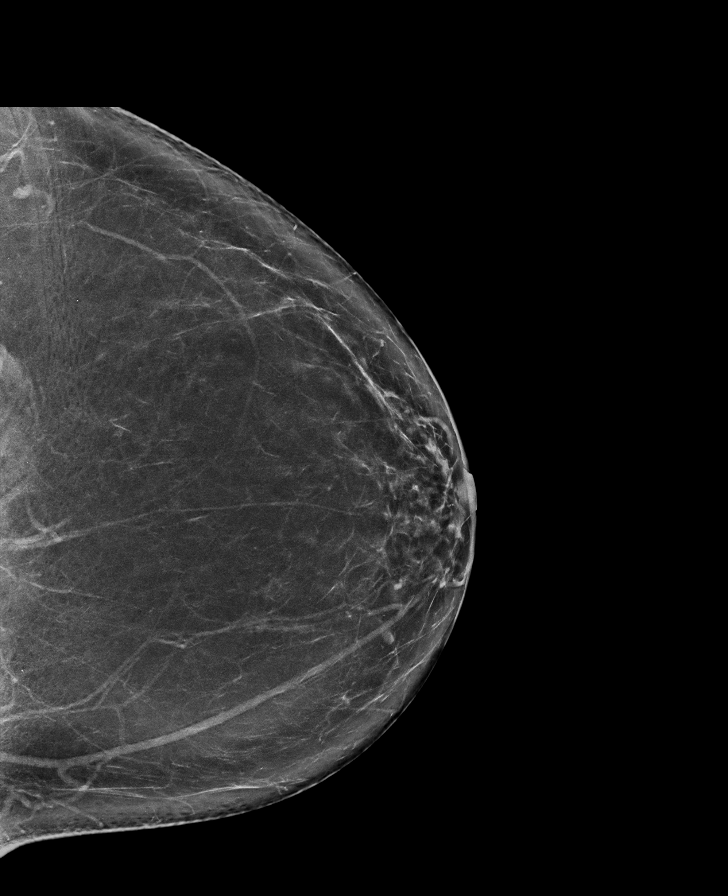

[L CC tomo · tomo slice 43/84.0]
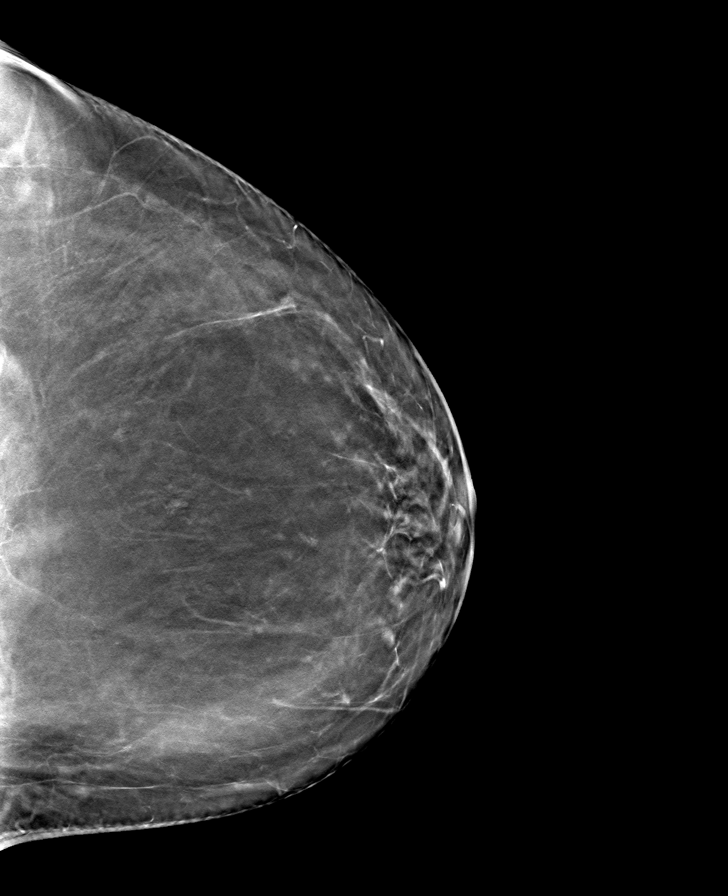

[R CC tomo · tomo slice 41/81.0]
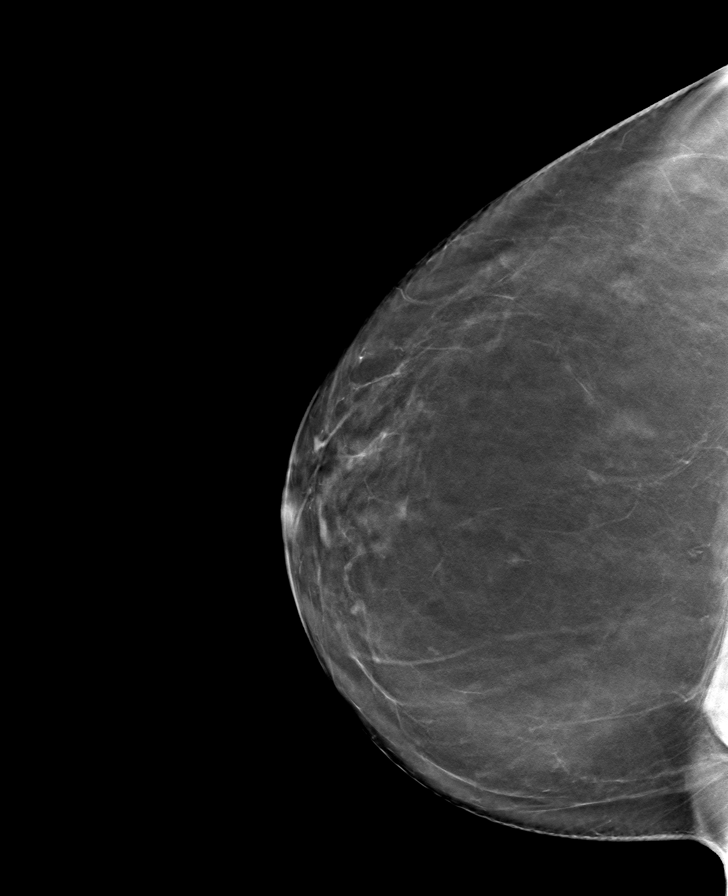

[R MLO tomo · tomo slice 44/87.0]
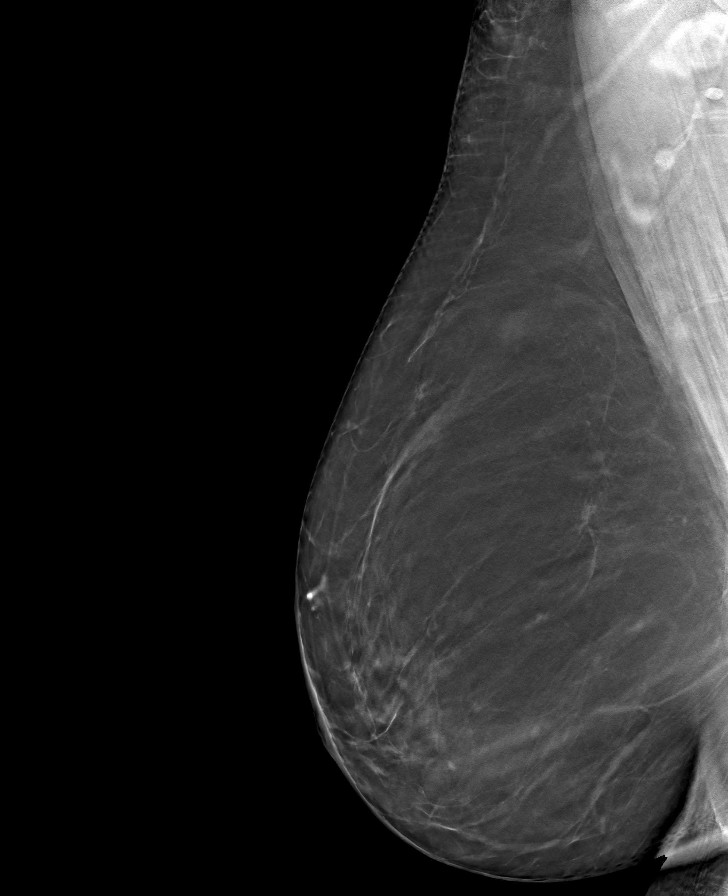

[L MLO tomo · tomo slice 43/84.0]
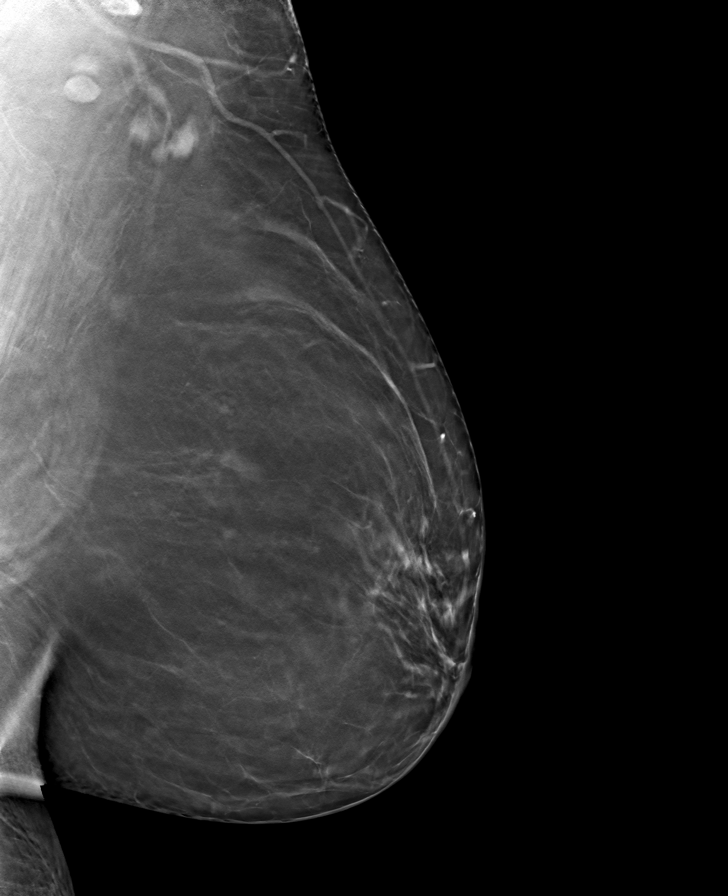

[8 of 24 positions shown; findings below may reference images not displayed]

FINDINGS: There are no findings suspicious for malignancy. The images were
evaluated with computer-aided detection.
IMPRESSION: No mammographic evidence of malignancy. A result letter of this
screening mammogram will be mailed directly to the patient.

RECOMMENDATION:
Screening mammogram in one year. (Code:JP-J-DD5)

BI-RADS CATEGORY  1: Negative.

## 2022-10-31 ENCOUNTER — Other Ambulatory Visit (HOSPITAL_BASED_OUTPATIENT_CLINIC_OR_DEPARTMENT_OTHER): Payer: Self-pay

## 2022-11-03 ENCOUNTER — Other Ambulatory Visit (HOSPITAL_BASED_OUTPATIENT_CLINIC_OR_DEPARTMENT_OTHER): Payer: Self-pay

## 2022-12-02 ENCOUNTER — Other Ambulatory Visit (HOSPITAL_BASED_OUTPATIENT_CLINIC_OR_DEPARTMENT_OTHER): Payer: Self-pay

## 2022-12-11 ENCOUNTER — Other Ambulatory Visit (HOSPITAL_BASED_OUTPATIENT_CLINIC_OR_DEPARTMENT_OTHER): Payer: Self-pay

## 2023-01-01 ENCOUNTER — Other Ambulatory Visit (HOSPITAL_BASED_OUTPATIENT_CLINIC_OR_DEPARTMENT_OTHER): Payer: Self-pay

## 2023-01-02 ENCOUNTER — Other Ambulatory Visit (HOSPITAL_BASED_OUTPATIENT_CLINIC_OR_DEPARTMENT_OTHER): Payer: Self-pay

## 2023-01-05 ENCOUNTER — Encounter (HOSPITAL_BASED_OUTPATIENT_CLINIC_OR_DEPARTMENT_OTHER): Payer: Self-pay | Admitting: Pharmacist

## 2023-01-05 ENCOUNTER — Other Ambulatory Visit (HOSPITAL_BASED_OUTPATIENT_CLINIC_OR_DEPARTMENT_OTHER): Payer: Self-pay

## 2023-01-06 ENCOUNTER — Other Ambulatory Visit (HOSPITAL_BASED_OUTPATIENT_CLINIC_OR_DEPARTMENT_OTHER): Payer: Self-pay

## 2023-02-12 ENCOUNTER — Other Ambulatory Visit (HOSPITAL_BASED_OUTPATIENT_CLINIC_OR_DEPARTMENT_OTHER): Payer: Self-pay

## 2023-03-16 ENCOUNTER — Other Ambulatory Visit (HOSPITAL_BASED_OUTPATIENT_CLINIC_OR_DEPARTMENT_OTHER): Payer: Self-pay

## 2023-03-16 MED ORDER — OZEMPIC (2 MG/DOSE) 8 MG/3ML ~~LOC~~ SOPN
2.0000 mg | PEN_INJECTOR | SUBCUTANEOUS | 3 refills | Status: AC
  Filled 2023-03-16 – 2023-03-17 (×2): qty 3, 28d supply, fill #0

## 2023-03-17 ENCOUNTER — Other Ambulatory Visit (HOSPITAL_BASED_OUTPATIENT_CLINIC_OR_DEPARTMENT_OTHER): Payer: Self-pay

## 2023-05-18 ENCOUNTER — Other Ambulatory Visit: Payer: Self-pay | Admitting: Obstetrics and Gynecology

## 2023-05-18 DIAGNOSIS — Z1231 Encounter for screening mammogram for malignant neoplasm of breast: Secondary | ICD-10-CM

## 2023-06-24 ENCOUNTER — Ambulatory Visit
Admission: RE | Admit: 2023-06-24 | Discharge: 2023-06-24 | Disposition: A | Discharge status: Home / Self Care | Payer: Medicare Other | Source: Ambulatory Visit | Attending: Obstetrics and Gynecology | Admitting: Obstetrics and Gynecology

## 2023-06-24 DIAGNOSIS — Z1231 Encounter for screening mammogram for malignant neoplasm of breast: Secondary | ICD-10-CM

## 2024-06-14 ENCOUNTER — Other Ambulatory Visit: Payer: Self-pay | Admitting: Obstetrics and Gynecology

## 2024-06-14 DIAGNOSIS — Z1231 Encounter for screening mammogram for malignant neoplasm of breast: Secondary | ICD-10-CM

## 2024-06-30 ENCOUNTER — Ambulatory Visit
Admission: RE | Admit: 2024-06-30 | Discharge: 2024-06-30 | Disposition: A | Discharge status: Home / Self Care | Source: Ambulatory Visit | Attending: Obstetrics and Gynecology | Admitting: Obstetrics and Gynecology

## 2024-06-30 DIAGNOSIS — Z1231 Encounter for screening mammogram for malignant neoplasm of breast: Secondary | ICD-10-CM
